# Patient Record
Sex: Female | Born: 2000 | Race: Black or African American | Hispanic: No | Marital: Single | Smoking: Never smoker
Health system: Southern US, Community
[De-identification: ages and names within clinical notes are randomized; demographics above are authoritative.]

## PROBLEM LIST (undated history)

## (undated) DIAGNOSIS — Z789 Other specified health status: Secondary | ICD-10-CM

---
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
## 2022-01-29 DIAGNOSIS — M5407 Panniculitis affecting regions of neck and back, lumbosacral region: Secondary | ICD-10-CM | POA: Diagnosis not present

## 2022-01-29 DIAGNOSIS — M9905 Segmental and somatic dysfunction of pelvic region: Secondary | ICD-10-CM | POA: Diagnosis not present

## 2022-01-29 DIAGNOSIS — M5406 Panniculitis affecting regions of neck and back, lumbar region: Secondary | ICD-10-CM | POA: Diagnosis not present

## 2022-01-29 DIAGNOSIS — M9903 Segmental and somatic dysfunction of lumbar region: Secondary | ICD-10-CM | POA: Diagnosis not present

## 2022-01-30 DIAGNOSIS — M5407 Panniculitis affecting regions of neck and back, lumbosacral region: Secondary | ICD-10-CM | POA: Diagnosis not present

## 2022-01-30 DIAGNOSIS — M9905 Segmental and somatic dysfunction of pelvic region: Secondary | ICD-10-CM | POA: Diagnosis not present

## 2022-01-30 DIAGNOSIS — M9903 Segmental and somatic dysfunction of lumbar region: Secondary | ICD-10-CM | POA: Diagnosis not present

## 2022-01-30 DIAGNOSIS — M5406 Panniculitis affecting regions of neck and back, lumbar region: Secondary | ICD-10-CM | POA: Diagnosis not present

## 2022-02-02 DIAGNOSIS — M5407 Panniculitis affecting regions of neck and back, lumbosacral region: Secondary | ICD-10-CM | POA: Diagnosis not present

## 2022-02-02 DIAGNOSIS — M5406 Panniculitis affecting regions of neck and back, lumbar region: Secondary | ICD-10-CM | POA: Diagnosis not present

## 2022-02-02 DIAGNOSIS — M9905 Segmental and somatic dysfunction of pelvic region: Secondary | ICD-10-CM | POA: Diagnosis not present

## 2022-02-02 DIAGNOSIS — M9903 Segmental and somatic dysfunction of lumbar region: Secondary | ICD-10-CM | POA: Diagnosis not present

## 2022-02-04 DIAGNOSIS — M5406 Panniculitis affecting regions of neck and back, lumbar region: Secondary | ICD-10-CM | POA: Diagnosis not present

## 2022-02-04 DIAGNOSIS — M5407 Panniculitis affecting regions of neck and back, lumbosacral region: Secondary | ICD-10-CM | POA: Diagnosis not present

## 2022-02-04 DIAGNOSIS — M9903 Segmental and somatic dysfunction of lumbar region: Secondary | ICD-10-CM | POA: Diagnosis not present

## 2022-02-04 DIAGNOSIS — M9905 Segmental and somatic dysfunction of pelvic region: Secondary | ICD-10-CM | POA: Diagnosis not present

## 2022-02-05 DIAGNOSIS — M9903 Segmental and somatic dysfunction of lumbar region: Secondary | ICD-10-CM | POA: Diagnosis not present

## 2022-02-05 DIAGNOSIS — M5407 Panniculitis affecting regions of neck and back, lumbosacral region: Secondary | ICD-10-CM | POA: Diagnosis not present

## 2022-02-05 DIAGNOSIS — M9905 Segmental and somatic dysfunction of pelvic region: Secondary | ICD-10-CM | POA: Diagnosis not present

## 2022-02-05 DIAGNOSIS — M5406 Panniculitis affecting regions of neck and back, lumbar region: Secondary | ICD-10-CM | POA: Diagnosis not present

## 2022-02-11 DIAGNOSIS — M9905 Segmental and somatic dysfunction of pelvic region: Secondary | ICD-10-CM | POA: Diagnosis not present

## 2022-02-11 DIAGNOSIS — M5406 Panniculitis affecting regions of neck and back, lumbar region: Secondary | ICD-10-CM | POA: Diagnosis not present

## 2022-02-11 DIAGNOSIS — M5407 Panniculitis affecting regions of neck and back, lumbosacral region: Secondary | ICD-10-CM | POA: Diagnosis not present

## 2022-02-11 DIAGNOSIS — M9903 Segmental and somatic dysfunction of lumbar region: Secondary | ICD-10-CM | POA: Diagnosis not present

## 2022-02-12 DIAGNOSIS — M5406 Panniculitis affecting regions of neck and back, lumbar region: Secondary | ICD-10-CM | POA: Diagnosis not present

## 2022-02-12 DIAGNOSIS — M9903 Segmental and somatic dysfunction of lumbar region: Secondary | ICD-10-CM | POA: Diagnosis not present

## 2022-02-12 DIAGNOSIS — M9905 Segmental and somatic dysfunction of pelvic region: Secondary | ICD-10-CM | POA: Diagnosis not present

## 2022-02-12 DIAGNOSIS — M5407 Panniculitis affecting regions of neck and back, lumbosacral region: Secondary | ICD-10-CM | POA: Diagnosis not present

## 2022-02-16 DIAGNOSIS — M9905 Segmental and somatic dysfunction of pelvic region: Secondary | ICD-10-CM | POA: Diagnosis not present

## 2022-02-16 DIAGNOSIS — M9903 Segmental and somatic dysfunction of lumbar region: Secondary | ICD-10-CM | POA: Diagnosis not present

## 2022-02-16 DIAGNOSIS — M5406 Panniculitis affecting regions of neck and back, lumbar region: Secondary | ICD-10-CM | POA: Diagnosis not present

## 2022-02-16 DIAGNOSIS — M5407 Panniculitis affecting regions of neck and back, lumbosacral region: Secondary | ICD-10-CM | POA: Diagnosis not present

## 2022-02-18 DIAGNOSIS — M9903 Segmental and somatic dysfunction of lumbar region: Secondary | ICD-10-CM | POA: Diagnosis not present

## 2022-02-18 DIAGNOSIS — M5407 Panniculitis affecting regions of neck and back, lumbosacral region: Secondary | ICD-10-CM | POA: Diagnosis not present

## 2022-02-18 DIAGNOSIS — M9905 Segmental and somatic dysfunction of pelvic region: Secondary | ICD-10-CM | POA: Diagnosis not present

## 2022-02-18 DIAGNOSIS — M5406 Panniculitis affecting regions of neck and back, lumbar region: Secondary | ICD-10-CM | POA: Diagnosis not present

## 2022-02-20 DIAGNOSIS — Z1339 Encounter for screening examination for other mental health and behavioral disorders: Secondary | ICD-10-CM | POA: Diagnosis not present

## 2022-02-20 DIAGNOSIS — M62830 Muscle spasm of back: Secondary | ICD-10-CM | POA: Diagnosis not present

## 2022-02-20 DIAGNOSIS — Z7689 Persons encountering health services in other specified circumstances: Secondary | ICD-10-CM | POA: Diagnosis not present

## 2022-02-20 DIAGNOSIS — R519 Headache, unspecified: Secondary | ICD-10-CM | POA: Diagnosis not present

## 2022-02-20 DIAGNOSIS — M546 Pain in thoracic spine: Secondary | ICD-10-CM | POA: Diagnosis not present

## 2022-02-26 DIAGNOSIS — M5407 Panniculitis affecting regions of neck and back, lumbosacral region: Secondary | ICD-10-CM | POA: Diagnosis not present

## 2022-02-26 DIAGNOSIS — M9905 Segmental and somatic dysfunction of pelvic region: Secondary | ICD-10-CM | POA: Diagnosis not present

## 2022-02-26 DIAGNOSIS — M5406 Panniculitis affecting regions of neck and back, lumbar region: Secondary | ICD-10-CM | POA: Diagnosis not present

## 2022-02-26 DIAGNOSIS — M9903 Segmental and somatic dysfunction of lumbar region: Secondary | ICD-10-CM | POA: Diagnosis not present

## 2022-03-02 DIAGNOSIS — M9905 Segmental and somatic dysfunction of pelvic region: Secondary | ICD-10-CM | POA: Diagnosis not present

## 2022-03-02 DIAGNOSIS — M5407 Panniculitis affecting regions of neck and back, lumbosacral region: Secondary | ICD-10-CM | POA: Diagnosis not present

## 2022-03-02 DIAGNOSIS — M5406 Panniculitis affecting regions of neck and back, lumbar region: Secondary | ICD-10-CM | POA: Diagnosis not present

## 2022-03-02 DIAGNOSIS — M9903 Segmental and somatic dysfunction of lumbar region: Secondary | ICD-10-CM | POA: Diagnosis not present

## 2022-03-05 DIAGNOSIS — M5407 Panniculitis affecting regions of neck and back, lumbosacral region: Secondary | ICD-10-CM | POA: Diagnosis not present

## 2022-03-05 DIAGNOSIS — M5406 Panniculitis affecting regions of neck and back, lumbar region: Secondary | ICD-10-CM | POA: Diagnosis not present

## 2022-03-05 DIAGNOSIS — M9905 Segmental and somatic dysfunction of pelvic region: Secondary | ICD-10-CM | POA: Diagnosis not present

## 2022-03-05 DIAGNOSIS — M9903 Segmental and somatic dysfunction of lumbar region: Secondary | ICD-10-CM | POA: Diagnosis not present

## 2022-03-26 DIAGNOSIS — Y998 Other external cause status: Secondary | ICD-10-CM | POA: Diagnosis not present

## 2022-03-26 DIAGNOSIS — M25561 Pain in right knee: Secondary | ICD-10-CM | POA: Diagnosis not present

## 2022-03-26 DIAGNOSIS — M25461 Effusion, right knee: Secondary | ICD-10-CM | POA: Diagnosis not present

## 2022-03-26 DIAGNOSIS — Y9367 Activity, basketball: Secondary | ICD-10-CM | POA: Diagnosis not present

## 2022-03-31 DIAGNOSIS — S83511A Sprain of anterior cruciate ligament of right knee, initial encounter: Secondary | ICD-10-CM | POA: Diagnosis not present

## 2022-03-31 DIAGNOSIS — S83411A Sprain of medial collateral ligament of right knee, initial encounter: Secondary | ICD-10-CM | POA: Diagnosis not present

## 2022-04-02 DIAGNOSIS — S83511A Sprain of anterior cruciate ligament of right knee, initial encounter: Secondary | ICD-10-CM | POA: Diagnosis not present

## 2022-04-02 DIAGNOSIS — M25561 Pain in right knee: Secondary | ICD-10-CM | POA: Diagnosis not present

## 2022-04-06 DIAGNOSIS — S83511A Sprain of anterior cruciate ligament of right knee, initial encounter: Secondary | ICD-10-CM | POA: Diagnosis not present

## 2022-04-08 ENCOUNTER — Other Ambulatory Visit: Payer: Self-pay | Admitting: Orthopedic Surgery

## 2022-04-15 DIAGNOSIS — M25661 Stiffness of right knee, not elsewhere classified: Secondary | ICD-10-CM | POA: Diagnosis not present

## 2022-04-15 DIAGNOSIS — S83511A Sprain of anterior cruciate ligament of right knee, initial encounter: Secondary | ICD-10-CM | POA: Diagnosis not present

## 2022-04-15 DIAGNOSIS — M25561 Pain in right knee: Secondary | ICD-10-CM | POA: Diagnosis not present

## 2022-04-15 DIAGNOSIS — M6281 Muscle weakness (generalized): Secondary | ICD-10-CM | POA: Diagnosis not present

## 2022-04-23 ENCOUNTER — Other Ambulatory Visit: Payer: Self-pay

## 2022-04-23 ENCOUNTER — Encounter
Admission: RE | Admit: 2022-04-23 | Discharge: 2022-04-23 | Disposition: A | Discharge status: Home / Self Care | Payer: BC Managed Care – PPO | Source: Ambulatory Visit | Attending: Orthopedic Surgery | Admitting: Orthopedic Surgery

## 2022-04-23 DIAGNOSIS — Z01812 Encounter for preprocedural laboratory examination: Secondary | ICD-10-CM

## 2022-04-23 NOTE — Patient Instructions (Addendum)
Your procedure is scheduled on: 04/30/22 - Thursday Report to the Registration Desk on the 1st floor of the [facility]. To find out your arrival time, please call [phone] between 1PM - 3PM on: 04/29/22 - Wednesday If your arrival time is 6:00 am, do not arrive prior to that time as the [facility] entrance doors do not open until 6:00 am.  REMEMBER: Instructions that are not followed completely may result in serious medical risk, up to and including death; or upon the discretion of your surgeon and anesthesiologist your surgery may need to be rescheduled.  Do not eat food after midnight the night before surgery.  No gum chewing, lozengers or hard candies.  You may however, drink CLEAR liquids up to 2 hours before you are scheduled to arrive for your surgery. Do not drink anything within 2 hours of your scheduled arrival time.  Clear liquids include: - water  - apple juice without pulp - gatorade (not RED colors) - black coffee or tea (Do NOT add milk or creamers to the coffee or tea) Do NOT drink anything that is not on this list.  In addition, your doctor has ordered for you to drink the provided  Ensure Pre-Surgery Clear Carbohydrate Drink  Drinking this carbohydrate drink up to two hours before surgery helps to reduce insulin resistance and improve patient outcomes. Please complete drinking 2 hours prior to scheduled arrival time.  TAKE THESE MEDICATIONS THE MORNING OF SURGERY WITH A SIP OF WATER: NONE  One week prior to surgery: Stop Anti-inflammatories (NSAIDS) such as Advil, Aleve, Ibuprofen, Motrin, Naproxen, Naprosyn and Aspirin based products such as Excedrin, Goodys Powder, BC Powder.  Stop ANY OVER THE COUNTER supplements until after surgery.  You may however, continue to take Tylenol if needed for pain up until the day of surgery.  No Alcohol for 24 hours before or after surgery.  No Smoking including e-cigarettes for 24 hours prior to surgery.  No chewable  tobacco products for at least 6 hours prior to surgery.  No nicotine patches on the day of surgery.  Do not use any "recreational" drugs for at least a week prior to your surgery.  Please be advised that the combination of cocaine and anesthesia may have negative outcomes, up to and including death. If you test positive for cocaine, your surgery will be cancelled.  On the morning of surgery brush your teeth with toothpaste and water, you may rinse your mouth with mouthwash if you wish. Do not swallow any toothpaste or mouthwash.  Use CHG Soap or wipes as directed on instruction sheet.  Do not wear jewelry, make-up, hairpins, clips or nail polish.  Do not wear lotions, powders, or perfumes.   Do not shave body from the neck down 48 hours prior to surgery just in case you cut yourself which could leave a site for infection.  Also, freshly shaved skin may become irritated if using the CHG soap.  Contact lenses, hearing aids and dentures may not be worn into surgery.  Do not bring valuables to the hospital. [facility] is not responsible for any missing/lost belongings or valuables.   Notify your doctor if there is any change in your medical condition (cold, fever, infection).  Wear comfortable clothing (specific to your surgery type) to the hospital.  After surgery, you can help prevent lung complications by doing breathing exercises.  Take deep breaths and cough every 1-2 hours. Your doctor may order a device called an Incentive Spirometer to help  you take deep breaths. When coughing or sneezing, hold a pillow firmly against your incision with both hands. This is called "splinting." Doing this helps protect your incision. It also decreases belly discomfort.  If you are being admitted to the hospital overnight, leave your suitcase in the car. After surgery it may be brought to your room.  If you are being discharged the day of surgery, you will not be allowed to drive home. You will  need a responsible adult (18 years or older) to drive you home and stay with you that night.   If you are taking public transportation, you will need to have a responsible adult (18 years or older) with you. Please confirm with your physician that it is acceptable to use public transportation.   Please call the Pre-admissions Testing Dept. at [phone] if you have any questions about these instructions.  Surgery Visitation Policy:  Patients undergoing a surgery or procedure may have two family members or support persons with them as long as the person is not COVID-19 positive or experiencing its symptoms.   Inpatient Visitation:    Visiting hours are 7 a.m. to 8 p.m. Up to four visitors are allowed at one time in a patient room. The visitors may rotate out with other people during the day. One designated support person (adult) may remain overnight.  Due to an increase in RSV and influenza rates and associated hospitalizations, children ages 33 and under will not be able to visit patients in [facility]. Masks continue to be strongly recommended.

## 2022-04-29 MED ORDER — ORAL CARE MOUTH RINSE: 15.0000 mL | Freq: Once | OROMUCOSAL | Status: AC

## 2022-04-29 MED ORDER — CEFAZOLIN SODIUM-DEXTROSE 2-4 GM/100ML-% IV SOLN
2.0000 g | INTRAVENOUS | Status: AC
  Administered 2022-04-30: 2 g via INTRAVENOUS

## 2022-04-29 MED ORDER — LACTATED RINGERS IV SOLN: INTRAVENOUS | Status: DC

## 2022-04-29 MED ORDER — FAMOTIDINE 20 MG PO TABS: 20.0000 mg | ORAL_TABLET | Freq: Once | ORAL | Status: AC

## 2022-04-29 MED ORDER — CHLORHEXIDINE GLUCONATE 0.12 % MT SOLN: 15.0000 mL | Freq: Once | OROMUCOSAL | Status: AC

## 2022-04-30 ENCOUNTER — Ambulatory Visit: Payer: BC Managed Care – PPO | Admitting: Certified Registered"

## 2022-04-30 ENCOUNTER — Encounter
Admission: RE | Disposition: A | Discharge status: Home / Self Care | Payer: Self-pay | Source: Ambulatory Visit | Attending: Orthopedic Surgery

## 2022-04-30 ENCOUNTER — Ambulatory Visit
Admission: RE | Admit: 2022-04-30 | Discharge: 2022-04-30 | Disposition: A | Discharge status: Home / Self Care | Payer: BC Managed Care – PPO | Source: Ambulatory Visit | Attending: Orthopedic Surgery | Admitting: Orthopedic Surgery

## 2022-04-30 ENCOUNTER — Other Ambulatory Visit: Payer: Self-pay

## 2022-04-30 ENCOUNTER — Encounter: Payer: Self-pay | Admitting: Orthopedic Surgery

## 2022-04-30 ENCOUNTER — Ambulatory Visit: Payer: BC Managed Care – PPO

## 2022-04-30 DIAGNOSIS — Y9367 Activity, basketball: Secondary | ICD-10-CM | POA: Diagnosis not present

## 2022-04-30 DIAGNOSIS — S83511A Sprain of anterior cruciate ligament of right knee, initial encounter: Secondary | ICD-10-CM | POA: Diagnosis not present

## 2022-04-30 DIAGNOSIS — Z01812 Encounter for preprocedural laboratory examination: Secondary | ICD-10-CM

## 2022-04-30 HISTORY — PX: KNEE ARTHROSCOPY WITH ANTERIOR CRUCIATE LIGAMENT (ACL) REPAIR WITH HAMSTRING GRAFT: SHX5645

## 2022-04-30 LAB — POCT PREGNANCY, URINE: Preg Test, Ur: NEGATIVE

## 2022-04-30 SURGERY — KNEE ARTHROSCOPY WITH ANTERIOR CRUCIATE LIGAMENT (ACL) REPAIR WITH HAMSTRING GRAFT
Anesthesia: General | Site: Knee | Laterality: Right

## 2022-04-30 MED ORDER — CEFAZOLIN SODIUM-DEXTROSE 2-4 GM/100ML-% IV SOLN
INTRAVENOUS | Status: AC
  Filled 2022-04-30: qty 100

## 2022-04-30 MED ORDER — SEVOFLURANE IN SOLN
RESPIRATORY_TRACT | Status: AC
  Filled 2022-04-30: qty 250

## 2022-04-30 MED ORDER — DEXMEDETOMIDINE HCL IN NACL 200 MCG/50ML IV SOLN
INTRAVENOUS | Status: DC | PRN
  Administered 2022-04-30 (×2): 4 ug via INTRAVENOUS

## 2022-04-30 MED ORDER — PROPOFOL 10 MG/ML IV BOLUS
INTRAVENOUS | Status: AC
  Filled 2022-04-30: qty 40

## 2022-04-30 MED ORDER — FENTANYL CITRATE (PF) 100 MCG/2ML IJ SOLN
INTRAMUSCULAR | Status: AC
  Filled 2022-04-30: qty 2

## 2022-04-30 MED ORDER — VANCOMYCIN HCL 1000 MG IV SOLR
INTRAVENOUS | Status: AC
  Filled 2022-04-30: qty 20

## 2022-04-30 MED ORDER — OXYCODONE HCL 5 MG PO TABS
ORAL_TABLET | ORAL | Status: AC
  Filled 2022-04-30: qty 1

## 2022-04-30 MED ORDER — BUPIVACAINE HCL (PF) 0.5 % IJ SOLN
INTRAMUSCULAR | Status: DC | PRN
  Administered 2022-04-30: 13 mL

## 2022-04-30 MED ORDER — FAMOTIDINE 20 MG PO TABS
ORAL_TABLET | ORAL | Status: AC
  Administered 2022-04-30: 20 mg via ORAL
  Filled 2022-04-30: qty 1

## 2022-04-30 MED ORDER — TRANEXAMIC ACID-NACL 1000-0.7 MG/100ML-% IV SOLN
INTRAVENOUS | Status: AC
  Filled 2022-04-30: qty 100

## 2022-04-30 MED ORDER — GLYCOPYRROLATE 0.2 MG/ML IJ SOLN
INTRAMUSCULAR | Status: AC
  Filled 2022-04-30: qty 1

## 2022-04-30 MED ORDER — ONDANSETRON HCL 4 MG/2ML IJ SOLN
INTRAMUSCULAR | Status: DC | PRN
  Administered 2022-04-30: 4 mg via INTRAVENOUS

## 2022-04-30 MED ORDER — LIDOCAINE HCL (CARDIAC) PF 100 MG/5ML IV SOSY
PREFILLED_SYRINGE | INTRAVENOUS | Status: DC | PRN
  Administered 2022-04-30: 60 mg via INTRAVENOUS

## 2022-04-30 MED ORDER — LIDOCAINE-EPINEPHRINE (PF) 1 %-1:200000 IJ SOLN
INTRAMUSCULAR | Status: AC
  Filled 2022-04-30: qty 30

## 2022-04-30 MED ORDER — CHLORHEXIDINE GLUCONATE 0.12 % MT SOLN
OROMUCOSAL | Status: AC
  Administered 2022-04-30: 15 mL via OROMUCOSAL
  Filled 2022-04-30: qty 15

## 2022-04-30 MED ORDER — PROMETHAZINE HCL 25 MG/ML IJ SOLN: 6.2500 mg | INTRAMUSCULAR | Status: DC | PRN

## 2022-04-30 MED ORDER — ACETAMINOPHEN 500 MG PO TABS: 1000.0000 mg | ORAL_TABLET | Freq: Three times a day (TID) | ORAL | 2 refills | Status: DC

## 2022-04-30 MED ORDER — OXYCODONE HCL 5 MG PO TABS: 5.0000 mg | ORAL_TABLET | ORAL | 0 refills | Status: DC | PRN

## 2022-04-30 MED ORDER — DEXAMETHASONE SODIUM PHOSPHATE 10 MG/ML IJ SOLN
INTRAMUSCULAR | Status: AC
  Filled 2022-04-30: qty 1

## 2022-04-30 MED ORDER — DEXMEDETOMIDINE HCL IN NACL 80 MCG/20ML IV SOLN
INTRAVENOUS | Status: AC
  Filled 2022-04-30: qty 20

## 2022-04-30 MED ORDER — EPINEPHRINE PF 1 MG/ML IJ SOLN
INTRAMUSCULAR | Status: AC
  Filled 2022-04-30: qty 3

## 2022-04-30 MED ORDER — HYDROMORPHONE HCL 1 MG/ML IJ SOLN
INTRAMUSCULAR | Status: AC
  Filled 2022-04-30: qty 1

## 2022-04-30 MED ORDER — ACETAMINOPHEN 10 MG/ML IV SOLN
INTRAVENOUS | Status: AC
  Filled 2022-04-30: qty 100

## 2022-04-30 MED ORDER — GABAPENTIN 300 MG PO CAPS: 300.0000 mg | ORAL_CAPSULE | Freq: Three times a day (TID) | ORAL | 0 refills | Status: DC

## 2022-04-30 MED ORDER — EPINEPHRINE PF 1 MG/ML IJ SOLN
INTRAMUSCULAR | Status: AC
  Filled 2022-04-30: qty 1

## 2022-04-30 MED ORDER — IBUPROFEN 800 MG PO TABS: 800.0000 mg | ORAL_TABLET | Freq: Three times a day (TID) | ORAL | 0 refills | Status: AC

## 2022-04-30 MED ORDER — KETOROLAC TROMETHAMINE 30 MG/ML IJ SOLN
INTRAMUSCULAR | Status: AC
  Filled 2022-04-30: qty 1

## 2022-04-30 MED ORDER — FENTANYL CITRATE (PF) 100 MCG/2ML IJ SOLN
INTRAMUSCULAR | Status: DC | PRN
  Administered 2022-04-30 (×2): 50 ug via INTRAVENOUS

## 2022-04-30 MED ORDER — TRANEXAMIC ACID-NACL 1000-0.7 MG/100ML-% IV SOLN
1000.0000 mg | Freq: Once | INTRAVENOUS | Status: AC
  Administered 2022-04-30: 1000 mg via INTRAVENOUS

## 2022-04-30 MED ORDER — DEXAMETHASONE SODIUM PHOSPHATE 10 MG/ML IJ SOLN
INTRAMUSCULAR | Status: DC | PRN
  Administered 2022-04-30: 10 mg via INTRAVENOUS

## 2022-04-30 MED ORDER — ONDANSETRON HCL 4 MG/2ML IJ SOLN
INTRAMUSCULAR | Status: AC
  Filled 2022-04-30: qty 2

## 2022-04-30 MED ORDER — BUPIVACAINE HCL (PF) 0.5 % IJ SOLN
INTRAMUSCULAR | Status: AC
  Filled 2022-04-30: qty 30

## 2022-04-30 MED ORDER — DIAZEPAM 5 MG PO TABS: 5.0000 mg | ORAL_TABLET | Freq: Three times a day (TID) | ORAL | 0 refills | Status: DC | PRN

## 2022-04-30 MED ORDER — MIDAZOLAM HCL 2 MG/2ML IJ SOLN
INTRAMUSCULAR | Status: DC | PRN
  Administered 2022-04-30 (×2): 1 mg via INTRAVENOUS

## 2022-04-30 MED ORDER — OXYCODONE HCL 5 MG PO TABS
5.0000 mg | ORAL_TABLET | Freq: Once | ORAL | Status: AC
  Administered 2022-04-30: 5 mg via ORAL

## 2022-04-30 MED ORDER — FENTANYL CITRATE (PF) 100 MCG/2ML IJ SOLN
25.0000 ug | INTRAMUSCULAR | Status: DC | PRN
  Administered 2022-04-30 (×2): 25 ug via INTRAVENOUS

## 2022-04-30 MED ORDER — PROPOFOL 10 MG/ML IV BOLUS
INTRAVENOUS | Status: DC | PRN
  Administered 2022-04-30: 150 mg via INTRAVENOUS

## 2022-04-30 MED ORDER — HYDROMORPHONE HCL 1 MG/ML IJ SOLN
INTRAMUSCULAR | Status: DC | PRN
  Administered 2022-04-30: 1 mg via INTRAVENOUS

## 2022-04-30 MED ORDER — ONDANSETRON 4 MG PO TBDP: 4.0000 mg | ORAL_TABLET | Freq: Three times a day (TID) | ORAL | 0 refills | Status: DC | PRN

## 2022-04-30 MED ORDER — KETOROLAC TROMETHAMINE 30 MG/ML IJ SOLN
INTRAMUSCULAR | Status: DC | PRN
  Administered 2022-04-30: 30 mg via INTRAVENOUS

## 2022-04-30 MED ORDER — ACETAMINOPHEN 10 MG/ML IV SOLN
INTRAVENOUS | Status: DC | PRN
  Administered 2022-04-30: 1000 mg via INTRAVENOUS

## 2022-04-30 MED ORDER — ASPIRIN 325 MG PO TBEC: 325.0000 mg | DELAYED_RELEASE_TABLET | Freq: Every day | ORAL | 0 refills | Status: AC

## 2022-04-30 MED ORDER — MIDAZOLAM HCL 2 MG/2ML IJ SOLN
INTRAMUSCULAR | Status: AC
  Filled 2022-04-30: qty 2

## 2022-04-30 MED ORDER — VANCOMYCIN HCL 1000 MG IV SOLR
INTRAVENOUS | Status: DC | PRN
  Administered 2022-04-30: 1000 mg

## 2022-04-30 MED ORDER — LACTATED RINGERS IR SOLN
Status: DC | PRN
  Administered 2022-04-30 (×2): 3000 mL

## 2022-04-30 MED ORDER — LACTATED RINGERS IV SOLN
INTRAVENOUS | Status: DC | PRN
  Administered 2022-04-30 (×4): 3001 mL

## 2022-04-30 MED ORDER — PHENYLEPHRINE HCL (PRESSORS) 10 MG/ML IV SOLN
INTRAVENOUS | Status: DC | PRN
  Administered 2022-04-30: 80 ug via INTRAVENOUS

## 2022-04-30 SURGICAL SUPPLY — 83 items
ADAPTER IRRIG TUBE 2 SPIKE SOL (ADAPTER) ×2 IMPLANT
ANCHOR BUTTON TIGHTROPE 14 (Anchor) IMPLANT
BLADE SHAVER 4.5X7 STR FR (MISCELLANEOUS) ×1 IMPLANT
BLADE SURG 15 STRL LF DISP TIS (BLADE) ×3 IMPLANT
BLADE SURG 15 STRL SS (BLADE) ×3
BLADE SURG SZ10 CARB STEEL (BLADE) ×1 IMPLANT
BLADE SURG SZ11 CARB STEEL (BLADE) ×1 IMPLANT
BNDG COHESIVE 4X5 TAN STRL LF (GAUZE/BANDAGES/DRESSINGS) ×1 IMPLANT
BNDG COHESIVE 6X5 TAN ST LF (GAUZE/BANDAGES/DRESSINGS) ×1 IMPLANT
BNDG ESMARK 6X12 TAN STRL LF (GAUZE/BANDAGES/DRESSINGS) ×1 IMPLANT
BRUSH SCRUB EZ  4% CHG (MISCELLANEOUS) ×1
BRUSH SCRUB EZ 4% CHG (MISCELLANEOUS) ×1 IMPLANT
BUR BR 5.5 WIDE MOUTH (BURR) IMPLANT
CHLORAPREP W/TINT 26 (MISCELLANEOUS) ×2 IMPLANT
COOLER POLAR GLACIER W/PUMP (MISCELLANEOUS) ×1 IMPLANT
COVER BACK TABLE REUSABLE LG (DRAPES) ×1 IMPLANT
CUFF TOURN SGL QUICK 24 (TOURNIQUET CUFF)
CUFF TOURN SGL QUICK 34 (TOURNIQUET CUFF)
CUFF TRNQT CYL 24X4X16.5-23 (TOURNIQUET CUFF) IMPLANT
CUFF TRNQT CYL 34X4.125X (TOURNIQUET CUFF) IMPLANT
DERMABOND ADVANCED .7 DNX12 (GAUZE/BANDAGES/DRESSINGS) ×1 IMPLANT
DRAPE 3/4 80X56 (DRAPES) ×1 IMPLANT
DRAPE ARTHRO LIMB 89X125 STRL (DRAPES) ×1 IMPLANT
DRAPE FLUOR MINI C-ARM 54X84 (DRAPES) ×1 IMPLANT
DRAPE IMP U-DRAPE 54X76 (DRAPES) ×1 IMPLANT
DRAPE POUCH INSTRU U-SHP 10X18 (DRAPES) ×1 IMPLANT
DRILL FLIPCUTTER III 6-12 (ORTHOPEDIC DISPOSABLE SUPPLIES) IMPLANT
ELECT REM PT RETURN 9FT ADLT (ELECTROSURGICAL) ×1
ELECTRODE REM PT RTRN 9FT ADLT (ELECTROSURGICAL) ×1 IMPLANT
FLIPCUTTER III 6-12 AR-1204FF (ORTHOPEDIC DISPOSABLE SUPPLIES) ×1
GAUZE SPONGE 4X4 12PLY STRL (GAUZE/BANDAGES/DRESSINGS) ×1 IMPLANT
GAUZE XEROFORM 1X8 LF (GAUZE/BANDAGES/DRESSINGS) ×1 IMPLANT
GLOVE BIOGEL PI IND STRL 8 (GLOVE) ×2 IMPLANT
GLOVE SURG ORTHO 8.0 STRL STRW (GLOVE) ×1 IMPLANT
GLOVE SURG SYN 8.0 (GLOVE) ×1 IMPLANT
GLOVE SURG SYN 8.0 PF PI (GLOVE) ×1 IMPLANT
GOWN STRL REUS W/ TWL LRG LVL3 (GOWN DISPOSABLE) ×1 IMPLANT
GOWN STRL REUS W/ TWL XL LVL3 (GOWN DISPOSABLE) ×1 IMPLANT
GOWN STRL REUS W/TWL LRG LVL3 (GOWN DISPOSABLE) ×1
GOWN STRL REUS W/TWL XL LVL3 (GOWN DISPOSABLE) ×1
GRADUATE 1200CC STRL 31836 (MISCELLANEOUS) ×1 IMPLANT
GUIDEWIRE 1.2MMX18 (WIRE) ×1 IMPLANT
IMP SYS 2ND FIX PEEK 4.75X19.1 (Miscellaneous) ×1 IMPLANT
IMPL SYS 2ND FX PEEK 4.75X19.1 (Miscellaneous) IMPLANT
IMPL TIGHTROP ABS ACL FIBERTG (Orthopedic Implant) IMPLANT
IMPL TIGHTROP FIBERTAG ACL (Orthopedic Implant) IMPLANT
IMPL TIGHTROPE ABS ACL FIBERTG (Orthopedic Implant) ×1 IMPLANT
IMPLANT TIGHTROPE FIBERTAG ACL (Orthopedic Implant) ×1 IMPLANT
IV LACTATED RINGER IRRG 3000ML (IV SOLUTION) ×6
IV LR IRRIG 3000ML ARTHROMATIC (IV SOLUTION) ×6 IMPLANT
KIT TRANSTIBIAL (DISPOSABLE) IMPLANT
KIT TURNOVER KIT A (KITS) ×1 IMPLANT
KNIFE BLADE PARALLEL SZ9 (BLADE) IMPLANT
MANIFOLD NEPTUNE II (INSTRUMENTS) ×2 IMPLANT
MAT ABSORB  FLUID 56X50 GRAY (MISCELLANEOUS) ×2
MAT ABSORB FLUID 56X50 GRAY (MISCELLANEOUS) ×2 IMPLANT
NEEDLE HYPO 22GX1.5 SAFETY (NEEDLE) ×1 IMPLANT
PACK ARTHROSCOPY KNEE (MISCELLANEOUS) ×1 IMPLANT
PAD ABD DERMACEA PRESS 5X9 (GAUZE/BANDAGES/DRESSINGS) ×2 IMPLANT
PAD WRAPON POLAR KNEE (MISCELLANEOUS) ×1 IMPLANT
PENCIL SMOKE EVACUATOR (MISCELLANEOUS) ×1 IMPLANT
REAMER ARTHRO 9.5 SS (MISCELLANEOUS) IMPLANT
SHAVER BLADE BONE CUTTER  5.5 (BLADE)
SHAVER BLADE BONE CUTTER 5.5 (BLADE) IMPLANT
SLEEVE REMOTE CONTROL 5X12 (DRAPES) IMPLANT
SPONGE T-LAP 18X18 ~~LOC~~+RFID (SPONGE) ×3 IMPLANT
SUT ETHILON 3-0 FS-10 30 BLK (SUTURE) ×1
SUT FIBERSNARE 2 CLSD LOOP (SUTURE) IMPLANT
SUT FIBERWIRE #2 38 T-5 BLUE (SUTURE) ×2
SUT MNCRL AB 4-0 PS2 18 (SUTURE) ×2 IMPLANT
SUT VIC AB 0 CT1 36 (SUTURE) ×1 IMPLANT
SUT VIC AB 2-0 CT2 27 (SUTURE) ×2 IMPLANT
SUTURE EHLN 3-0 FS-10 30 BLK (SUTURE) ×1 IMPLANT
SUTURE FIBERWR #2 38 T-5 BLUE (SUTURE) ×2 IMPLANT
SYR BULB IRRIG 60ML STRL (SYRINGE) ×1 IMPLANT
TAPE LABRALWHITE 1.5X36 (TAPE) IMPLANT
TRAP FLUID SMOKE EVACUATOR (MISCELLANEOUS) ×1 IMPLANT
TRAY FOLEY SLVR 16FR LF STAT (SET/KITS/TRAYS/PACK) ×1 IMPLANT
TUBING INFLOW SET DBFLO PUMP (TUBING) ×1 IMPLANT
TUBING OUTFLOW SET DBLFO PUMP (TUBING) ×1 IMPLANT
WAND WEREWOLF FLOW 90D (MISCELLANEOUS) IMPLANT
WATER STERILE IRR 500ML POUR (IV SOLUTION) ×1 IMPLANT
WRAPON POLAR PAD KNEE (MISCELLANEOUS) ×1

## 2022-04-30 NOTE — Anesthesia Preprocedure Evaluation (Signed)
Anesthesia Evaluation  Patient identified by MRN, date of birth, ID band Patient awake    Reviewed: Allergy & Precautions, H&P , NPO status , Patient's Chart, lab work & pertinent test results, reviewed documented beta blocker date and time   History of Anesthesia Complications Negative for: history of anesthetic complications  Airway Mallampati: III  TM Distance: >3 FB Neck ROM: full    Dental  (+) Dental Advidsory Given, Teeth Intact   Pulmonary neg pulmonary ROS   Pulmonary exam normal breath sounds clear to auscultation       Cardiovascular Exercise Tolerance: Good negative cardio ROS Normal cardiovascular exam Rhythm:regular Rate:Normal     Neuro/Psych negative neurological ROS  negative psych ROS   GI/Hepatic negative GI ROS, Neg liver ROS,,,  Endo/Other  negative endocrine ROS    Renal/GU negative Renal ROS  negative genitourinary   Musculoskeletal   Abdominal   Peds  Hematology negative hematology ROS (+)   Anesthesia Other Findings Past Medical History: 2023: ACL injury tear No date: No known health problems   Reproductive/Obstetrics negative OB ROS                             Anesthesia Physical Anesthesia Plan  ASA: 1  Anesthesia Plan: General   Post-op Pain Management:    Induction: Intravenous  PONV Risk Score and Plan: 3 and Ondansetron, Dexamethasone, Midazolam and Treatment may vary due to age or medical condition  Airway Management Planned: Oral ETT and LMA  Additional Equipment:   Intra-op Plan:   Post-operative Plan: Extubation in OR  Informed Consent: I have reviewed the patients History and Physical, chart, labs and discussed the procedure including the risks, benefits and alternatives for the proposed anesthesia with the patient or authorized representative who has indicated his/her understanding and acceptance.     Dental Advisory Given  Plan  Discussed with: Anesthesiologist, CRNA and Surgeon  Anesthesia Plan Comments:        Anesthesia Quick Evaluation

## 2022-04-30 NOTE — Op Note (Signed)
Operative Note    SURGERY DATE: 04/30/2022   PRE-OP DIAGNOSIS:  1.  Right knee anterior cruciate ligament tear   POST-OP DIAGNOSIS:  1.  Right knee anterior cruciate ligament tear  PROCEDURES:  1.  Right knee anterior cruciate ligament reconstruction with quadriceps tendon autograft   SURGEON: Cato Mulligan, MD  ASSISTANT: Reche Dixon, PA   ANESTHESIA: Gen    ESTIMATED BLOOD LOSS: 2cc   TOTAL IV FLUIDS: per anesthesia  INDICATION(S):  Lynn Booker is a 22 y.o. female who suffered a knee injury while playing basketball. MRI showed an ACL tear, which was consistent with the clinical exam.  We discussed risks of surgery including but not limited to possible ACL and/or meniscus re-tear, infection, bleeding, muscle/nerve damage, DVT, complications of anesthesia, and postoperative knee pain and arthrofibrosis. After discussion of risks, benefits, and alternatives to surgery, the patient and family elected to proceed.  After discussion of risks, benefits, and alternatives to surgery, the patient elected to proceed.     OPERATIVE FINDINGS:    Examination under anesthesia: A careful examination under anesthesia was performed.  Passive range of motion was: Hyperextension: 2.  Extension: 0.  Flexion: 140.  Lachman: 2B. Pivot Shift: grade 1.  Posterior drawer: normal.  Varus stability in full extension: normal.  Varus stability in 30 degrees of flexion: normal.  Valgus stability in full extension: normal.  Valgus stability in 30 degrees of flexion: normal.   Intra-operative findings: A thorough arthroscopic examination of the knee was performed.  The findings are: 1. Suprapatellar pouch: Normal 2. Undersurface of median ridge: Normal 3. Medial patellar facet: Normal 4. Lateral patellar facet: Normal 5. Trochlea: Normal 6. Lateral gutter/popliteus tendon: Normal 7. Hoffa's fat pad: Normal 8. Medial gutter/plica: Normal 9. ACL: Abnormal: Complete of the posterolateral bundle; intact, but  lax fibers anteromedial bundle 10. PCL: Normal 11. Medial meniscus: Normal 12. Medial compartment cartilage: Normal 13. Lateral meniscus: Normal 14. Lateral compartment cartilage: Normal, except small focal area of grade 1 changes to tibial plateau   OPERATIVE REPORT:    I identified Lynn Booker in the pre-operative holding area.  I marked the operative knee with my initials. I reviewed the risks and benefits of the proposed surgical intervention and the patient (and/or patient's guardian) wished to proceed.  Regional anesthesia was then performed by the anesthesia team.  The patient was transferred to the operative suite and placed in the supine position with all bony prominences padded.  Care was taken to ensure that the contralateral leg was placed in neutral position and that the operative leg was well-padded in the leg holder.     Appropriate IV antibiotics were administered within 30 minutes of incision. The extremity was then prepped and draped in standard fashion. A time out was performed confirming the correct extremity, correct patient and correct procedure.   I first directed my attention to the harvest of a quadriceps autograft.  The right lower extremity was exsanguinated with an Esmarch, and a thigh tourniquet was elevated to 250 mmHg.  The total tourniquet was let down after skin closure, and total tourniquet time was 117 minutes.  A 3cm incision was planned just proximal to the proximal pole of the patella.  The incision was made with a 15 blade, and subcutaneous fat was sharply excised to expose the quadriceps tendon.  A speculum retractor was placed anteriorly, and the quadriceps was easily visualized with the arthroscope.  The vastus lateralis and VMO were clearly identified, as was  the junction of the rectus femoris muscle with the proximal aspect of the quadriceps tendon.  Under direct visualization with the arthroscope, an Arthrex 3mm parallel blade was used to incise the  quadriceps tendon from its most proximal extent, to the junction with the patella. Care was taken not to violate the rectus femoris muscle.  Then, using a 15 blade, the graft was transected and elevated distally off the patella, creating a 7 mm thick partial thickness graft. The distal end of the graft was controlled with a #2 Fiberwire stitch, and the Arthrex quadriceps harvester/cutter was loaded over the graft.  At a length of 62 mm, the harvester/cutter was used to transect the graft proximally, and the graft was removed from the wound.  The arthroscope was used to confirm a partial thickness harvest with no violation of the anterior knee capsule.     On the back table, the graft was prepared in standard fashion.  The length of the graft was 62 mm.  Each end was prepared using an Arboriculturist.  The femoral end was secured around a TightRope RT, and the tibial end was secured around an ABS loop.  Additionally a LabralTape was placed through the femoral button to serve as an internal brace.  The femoral end of the graft was 19mm in diameter, the tibial end was 9.67mm in diameter. The graft was tensioned to 20 lbs and reserved for later use.  Of note, it was soaked in 5 mg/mL vancomycin solution to reduce risk of infection for at least 20 minutes prior to implantation.   Standard anterolateral portals was created with an 11-blade.  The arthroscope was introduced through the anterolateral portal, and a full diagnostic arthroscopy was performed as described above.   An anteromedial portal was made under needle localization. A shaver was introduced through the anteromedial portal and used to gently debride the fat pad to improve visualization. Then the ACL remnant was debrided using the shaver, leaving 1-2 mm stumps on the tibia for anatomic referencing.   Next, I created the femoral socket. This was performed with an outside-in technique using an Forensic psychologist. The retrograde femoral guide was  utilized. We made the lateral stab incision with a 15 blade and followed the angle of the drill sleeve to make the incision through the IT band down to bone. The drill sleeve was pushed down to bone on the lateral femoral condyle and the guide was placed on the anatomic footprint of the ACL. A 5mm tunnel 61mm in length was drilled. We then used a FiberStick to pass a suture through the femoral tunnel and out of the anteromedial portal.    I then directed my attention to preparation of the tibial tunnel. A tibial guide set at 60 degrees was inserted through the anteromedial portal and centered over the tibial footprint.  The drill sleeve was then advanced to the proximal medial tibia just at the junction of the tibial tubercle and the pes tendon, through a ~3cm incision.  The anticipated tunnel length was 40mm.  A guide pin was then drilled through the proximal tibia under direct arthroscopic visualization into the center of the ACL footprint.  This was then over-reamed with an Arthrex 9.29mm reamer. Bony debris and soft tissue was cleared from the metaphyseal opening with electrocautery and from the intra-articular aperture with a shaver.   The passing suture was then brought out of the tibial tunnel.  The graft was then advanced into place in standard fashion.  The femoral Tight Rope was deployed on the lateral cortex under direct arthroscopic visualization from the anteromedial portal.  Correct position on the lateral cortex was confirmed fluoroscopically.  The TightRope was then shortened until at least 20 mm of graft was in the femoral tunnel.     I then directed my attention to tibial fixation.  This was performed with the knee in full extension with an axial and posterior drawer load applied to the tibia.  A 64mm ABS concave button was loaded over the ABS loop, and the loop was shortened until the button was flush with the anteromedial tibial cortex. The knee was then cycled 20 times, and both the femoral  and tibial button were tightened as much as possible with the knee in full extension. The tibial sutures were tightened.  A hole for a 4.75 mm SwiveLock was drilled approximately 2 cm distal to the tibial tunnel.  The ends of the tibial sutures and FiberTape for internal brace were placed under tension and the anchor was advanced.  This served as a back-up tibial fixation.   A repeat examination under anesthesia was performed.  The patient retained full hyperextension and flexion. The Lachman's was normalized. The arthroscope was re-introduced into the knee joint, confirming excellent position and tension of the quadriceps autograft. There was no lateral wall or roof impingement. Tibial and femoral sutures were cut.   The wounds were irrigated. 2-0 Vicryl was used to close the subdermal layers of both the quadriceps tendon harvest and the tibial tunnel incisions.  The harvest incision and the proximal medial tibial incisions were then closed with 4-0 Monocryl and Dermabond.  The arthroscopy portals and lateral femoral incision were closed with 3-0 Nylon.  A sterile dressing was applied, followed by a Polar Care device and a hinged knee brace locked in full extension.   The patient was awakened from anesthesia without difficulty and was transferred to the PACU in stable condition.   Of note, assistance from a PA was essential to performing the surgery.  PA was present for the entire surgery.  PA assisted with patient positioning, graft preparation, retraction, instrumentation, and wound closure. The surgery would have been more difficult and had longer operative time without PA assistance.    POSTOPERATIVE PLAN: The patient will be discharged home today once they meet PACU criteria. WBAT with hinged knee brace locked in extension.  Aspirin 325 mg daily for 2 weeks for DVT prophylaxis. Start physical therapy on POD#3-4. Follow up in 2 weeks per protocol.

## 2022-04-30 NOTE — H&P (Signed)
Delayed entry. Reviewed @720am .  Paper H&P to be scanned into permanent record. H&P reviewed. No significant changes noted.

## 2022-04-30 NOTE — Discharge Instructions (Addendum)
Arthroscopic ACL Surgery   Post-Op Instructions   1. Bracing or crutches: Crutches will be provided at the time of discharge from the surgery center.    2. Ice: You may be provided with a device [facility]) that allows you to ice the affected area effectively. Otherwise you can ice manually.   3. Driving:  Driving: Off all narcotic pain meds when operating vehicle   1 week for automatic cars, left leg surgery  2-4 weeks for standard/manual cars or right leg surgery   4. Activity: Ankle pumps several times an hour while awake to prevent blood clots. Weight bearing: full weight is permitted with brace locked in extension for 1 week. Then brace can be unlocked. Use crutches if there is pain and limping. Bending and straightening the knee is unlimited. Elevate knee above heart level as much as possible for one week. Avoid standing more than 5 minutes (consecutively) for the first week. No exercise involving the knee until cleared by the surgeon or physical therapist. Ideally, you should avoid long distance travel for 4 weeks.   5. Medications:  - You have been provided a prescription for narcotic pain medicine (oxycodone). After surgery, take 1-2 narcotic tablets every 4 hours if needed for severe pain.  - A prescription for anti-nausea medication (Zofran) will be provided in case the narcotic medicine causes nausea - take 1 tablet every 6 hours only if nauseated.  - Take ibuprofen 800 mg every 8 hours with food to reduce post-operative knee swelling. DO NOT STOP IBUPROFEN POST-OP UNTIL INSTRUCTED TO DO SO at first post-op office visit (10-14 days after surgery).  - Take enteric coated aspirin 325 mg once daily for 2 weeks to prevent blood clots.  -Take tylenol 1000 mg every 8 hours for pain.  May stop tylenol ~1-2 weeks after surgery if you are having minimal pain. - Take gabapentin 300 mg three times daily for 5 days - Take Valium 5mg  three times daily x 2 weeks. May stop if not having any  significant muscle spasms.    If you are taking prescription medication for anxiety, depression, insomnia, muscle spasm, chronic pain, or for attention deficit disorder you are advised that you are at a higher risk of adverse effects with use of narcotics post-op, including narcotic addiction/dependence, depressed breathing, death. If you use non-prescribed substances: alcohol, marijuana, cocaine, heroin, methamphetamines, etc., you are at a higher risk of adverse effects with use of narcotics post-op, including narcotic addiction/dependence, depressed breathing, death. You are advised that taking > 50 morphine milligram equivalents (MME) of narcotic pain medication per day results in twice the risk of overdose or death. For your prescription provided: oxycodone 5 mg - taking more than 6 tablets per day. Be advised that we will prescribe narcotics short-term, for acute post-operative pain only - 1 week for minor operations such as knee arthroscopy for meniscus tear resection, and 3 weeks for major operations such as knee repair/reconstruction surgeries.   6. Bandages: The physical therapist should change the bandages at the first post-op appointment. If needed, the dressing supplies have been provided to you.   7. Physical Therapy: 2 times per week for the first 4 weeks, then 1-2 times per week from weeks 4-8 post-op. Therapy typically starts within 1 week. You have been provided an order for physical therapy. The therapist will provide home exercises. Attending physical therapy and performing the appropriate exercises on your own at home daily will determine how well you do after this surgery. If  you do not know when your first physical therapy appointment is, please call the office at [phone].   8. Work/School: May return when able to tolerate standing for greater than 2 hours and off of narcotic pain medicaitons   9. Post-Op Appointments: Your first post-op appointment will be with Dr. Posey Pronto in  approximately 2 weeks time.    If you find that they have not been scheduled please call the Orthopaedic Appointment front desk at [phone].   AMBULATORY SURGERY  DISCHARGE INSTRUCTIONS   The drugs that you were given will stay in your system until tomorrow so for the next 24 hours you should not:  Drive an automobile Make any legal decisions Drink any alcoholic beverage   You may resume regular meals tomorrow.  Today it is better to start with liquids and gradually work up to solid foods.  You may eat anything you prefer, but it is better to start with liquids, then soup and crackers, and gradually work up to solid foods.   Please notify your doctor immediately if you have any unusual bleeding, trouble breathing, redness and pain at the surgery site, drainage, fever, or pain not relieved by medication.      Additional Instructions:

## 2022-04-30 NOTE — Anesthesia Procedure Notes (Signed)
Procedure Name: LMA Insertion Date/Time: 04/30/2022 7:45 AM  Performed by: Patience Musca., CRNAPre-anesthesia Checklist: Patient identified, Patient being monitored, Timeout performed, Emergency Drugs available and Suction available Patient Re-evaluated:Patient Re-evaluated prior to induction Oxygen Delivery Method: Circle system utilized Preoxygenation: Pre-oxygenation with 100% oxygen Induction Type: IV induction Ventilation: Mask ventilation without difficulty LMA: LMA inserted Laryngoscope Size: 4 Tube type: Oral Number of attempts: 1 Placement Confirmation: positive ETCO2 and breath sounds checked- equal and bilateral Tube secured with: Tape Dental Injury: Teeth and Oropharynx as per pre-operative assessment

## 2022-04-30 NOTE — Transfer of Care (Signed)
Immediate Anesthesia Transfer of Care Note  Patient: Lynn Booker  Procedure(s) Performed: Right arthroscopic ACL reconstruction using quadriceps tendon autograft (Right: Knee)  Patient Location: PACU  Anesthesia Type:General  Level of Consciousness: awake, alert , and oriented  Airway & Oxygen Therapy: Patient Spontanous Breathing  Post-op Assessment: Report given to RN, Post -op Vital signs reviewed and stable, and Patient moving all extremities X 4  Post vital signs: Reviewed and stable  Last Vitals:  Vitals Value Taken Time  BP 132/80 04/30/22 1016  Temp    Pulse 78 04/30/22 1019  Resp 11 04/30/22 1019  SpO2 99 % 04/30/22 1019  Vitals shown include unvalidated device data.  Last Pain:  Vitals:   04/30/22 0631  TempSrc: Temporal  PainSc: 0-No pain         Complications: No notable events documented.

## 2022-05-01 ENCOUNTER — Encounter: Payer: Self-pay | Admitting: Orthopedic Surgery

## 2022-05-04 DIAGNOSIS — M6281 Muscle weakness (generalized): Secondary | ICD-10-CM | POA: Diagnosis not present

## 2022-05-04 DIAGNOSIS — M25661 Stiffness of right knee, not elsewhere classified: Secondary | ICD-10-CM | POA: Diagnosis not present

## 2022-05-04 DIAGNOSIS — M25561 Pain in right knee: Secondary | ICD-10-CM | POA: Diagnosis not present

## 2022-05-06 DIAGNOSIS — M6281 Muscle weakness (generalized): Secondary | ICD-10-CM | POA: Diagnosis not present

## 2022-05-06 DIAGNOSIS — M25561 Pain in right knee: Secondary | ICD-10-CM | POA: Diagnosis not present

## 2022-05-06 DIAGNOSIS — M25661 Stiffness of right knee, not elsewhere classified: Secondary | ICD-10-CM | POA: Diagnosis not present

## 2022-05-07 NOTE — Anesthesia Postprocedure Evaluation (Signed)
Anesthesia Post Note  Patient: Lynn Booker  Procedure(s) Performed: Right arthroscopic ACL reconstruction using quadriceps tendon autograft (Right: Knee)  Patient location during evaluation: PACU Anesthesia Type: General Level of consciousness: awake and alert Pain management: pain level controlled Vital Signs Assessment: post-procedure vital signs reviewed and stable Respiratory status: spontaneous breathing, nonlabored ventilation, respiratory function stable and patient connected to nasal cannula oxygen Cardiovascular status: blood pressure returned to baseline and stable Postop Assessment: no apparent nausea or vomiting Anesthetic complications: no   There were no known notable events for this encounter.   Last Vitals:  Vitals:   04/30/22 1141 04/30/22 1159  BP: (!) 140/98 138/87  Pulse: 64 62  Resp: (!) 7 15  Temp: (!) 36.3 C (!) 36.3 C  SpO2: 100% 95%    Last Pain:  Vitals:   04/30/22 1159  TempSrc: Temporal  PainSc: 3                  Martha Clan

## 2022-05-11 DIAGNOSIS — M25661 Stiffness of right knee, not elsewhere classified: Secondary | ICD-10-CM | POA: Diagnosis not present

## 2022-05-11 DIAGNOSIS — M6281 Muscle weakness (generalized): Secondary | ICD-10-CM | POA: Diagnosis not present

## 2022-05-11 DIAGNOSIS — M25561 Pain in right knee: Secondary | ICD-10-CM | POA: Diagnosis not present

## 2022-05-14 DIAGNOSIS — M25661 Stiffness of right knee, not elsewhere classified: Secondary | ICD-10-CM | POA: Diagnosis not present

## 2022-05-14 DIAGNOSIS — M25561 Pain in right knee: Secondary | ICD-10-CM | POA: Diagnosis not present

## 2022-05-14 DIAGNOSIS — M6281 Muscle weakness (generalized): Secondary | ICD-10-CM | POA: Diagnosis not present

## 2022-05-15 DIAGNOSIS — Z23 Encounter for immunization: Secondary | ICD-10-CM | POA: Diagnosis not present

## 2022-05-15 DIAGNOSIS — Z6827 Body mass index (BMI) 27.0-27.9, adult: Secondary | ICD-10-CM | POA: Diagnosis not present

## 2022-05-15 DIAGNOSIS — Z0000 Encounter for general adult medical examination without abnormal findings: Secondary | ICD-10-CM | POA: Diagnosis not present

## 2022-05-15 DIAGNOSIS — Z713 Dietary counseling and surveillance: Secondary | ICD-10-CM | POA: Diagnosis not present

## 2022-05-15 DIAGNOSIS — Z7189 Other specified counseling: Secondary | ICD-10-CM | POA: Diagnosis not present

## 2022-05-15 DIAGNOSIS — Z113 Encounter for screening for infections with a predominantly sexual mode of transmission: Secondary | ICD-10-CM | POA: Diagnosis not present

## 2022-05-19 DIAGNOSIS — M25661 Stiffness of right knee, not elsewhere classified: Secondary | ICD-10-CM | POA: Diagnosis not present

## 2022-05-19 DIAGNOSIS — M6281 Muscle weakness (generalized): Secondary | ICD-10-CM | POA: Diagnosis not present

## 2022-05-19 DIAGNOSIS — M25561 Pain in right knee: Secondary | ICD-10-CM | POA: Diagnosis not present

## 2022-05-21 DIAGNOSIS — M25661 Stiffness of right knee, not elsewhere classified: Secondary | ICD-10-CM | POA: Diagnosis not present

## 2022-05-21 DIAGNOSIS — M6281 Muscle weakness (generalized): Secondary | ICD-10-CM | POA: Diagnosis not present

## 2022-05-21 DIAGNOSIS — M25561 Pain in right knee: Secondary | ICD-10-CM | POA: Diagnosis not present

## 2022-05-26 DIAGNOSIS — M6281 Muscle weakness (generalized): Secondary | ICD-10-CM | POA: Diagnosis not present

## 2022-05-26 DIAGNOSIS — M25661 Stiffness of right knee, not elsewhere classified: Secondary | ICD-10-CM | POA: Diagnosis not present

## 2022-05-26 DIAGNOSIS — M25561 Pain in right knee: Secondary | ICD-10-CM | POA: Diagnosis not present

## 2022-05-28 DIAGNOSIS — M6281 Muscle weakness (generalized): Secondary | ICD-10-CM | POA: Diagnosis not present

## 2022-05-28 DIAGNOSIS — M25661 Stiffness of right knee, not elsewhere classified: Secondary | ICD-10-CM | POA: Diagnosis not present

## 2022-05-28 DIAGNOSIS — M25561 Pain in right knee: Secondary | ICD-10-CM | POA: Diagnosis not present

## 2022-06-04 DIAGNOSIS — M6281 Muscle weakness (generalized): Secondary | ICD-10-CM | POA: Diagnosis not present

## 2022-06-04 DIAGNOSIS — M25661 Stiffness of right knee, not elsewhere classified: Secondary | ICD-10-CM | POA: Diagnosis not present

## 2022-06-04 DIAGNOSIS — M25561 Pain in right knee: Secondary | ICD-10-CM | POA: Diagnosis not present

## 2022-06-09 DIAGNOSIS — M6281 Muscle weakness (generalized): Secondary | ICD-10-CM | POA: Diagnosis not present

## 2022-06-09 DIAGNOSIS — M25661 Stiffness of right knee, not elsewhere classified: Secondary | ICD-10-CM | POA: Diagnosis not present

## 2022-06-09 DIAGNOSIS — M25561 Pain in right knee: Secondary | ICD-10-CM | POA: Diagnosis not present

## 2022-06-11 DIAGNOSIS — M25561 Pain in right knee: Secondary | ICD-10-CM | POA: Diagnosis not present

## 2022-06-11 DIAGNOSIS — S83511D Sprain of anterior cruciate ligament of right knee, subsequent encounter: Secondary | ICD-10-CM | POA: Diagnosis not present

## 2022-06-11 DIAGNOSIS — M25661 Stiffness of right knee, not elsewhere classified: Secondary | ICD-10-CM | POA: Diagnosis not present

## 2022-06-11 DIAGNOSIS — M6281 Muscle weakness (generalized): Secondary | ICD-10-CM | POA: Diagnosis not present

## 2022-06-16 DIAGNOSIS — M25661 Stiffness of right knee, not elsewhere classified: Secondary | ICD-10-CM | POA: Diagnosis not present

## 2022-06-16 DIAGNOSIS — M25561 Pain in right knee: Secondary | ICD-10-CM | POA: Diagnosis not present

## 2022-06-16 DIAGNOSIS — M6281 Muscle weakness (generalized): Secondary | ICD-10-CM | POA: Diagnosis not present

## 2022-06-23 DIAGNOSIS — M25561 Pain in right knee: Secondary | ICD-10-CM | POA: Diagnosis not present

## 2022-06-23 DIAGNOSIS — M6281 Muscle weakness (generalized): Secondary | ICD-10-CM | POA: Diagnosis not present

## 2022-06-23 DIAGNOSIS — M25661 Stiffness of right knee, not elsewhere classified: Secondary | ICD-10-CM | POA: Diagnosis not present

## 2022-06-25 DIAGNOSIS — M25661 Stiffness of right knee, not elsewhere classified: Secondary | ICD-10-CM | POA: Diagnosis not present

## 2022-06-25 DIAGNOSIS — M25561 Pain in right knee: Secondary | ICD-10-CM | POA: Diagnosis not present

## 2022-06-25 DIAGNOSIS — M6281 Muscle weakness (generalized): Secondary | ICD-10-CM | POA: Diagnosis not present

## 2022-06-30 DIAGNOSIS — M25661 Stiffness of right knee, not elsewhere classified: Secondary | ICD-10-CM | POA: Diagnosis not present

## 2022-06-30 DIAGNOSIS — M6281 Muscle weakness (generalized): Secondary | ICD-10-CM | POA: Diagnosis not present

## 2022-06-30 DIAGNOSIS — M25561 Pain in right knee: Secondary | ICD-10-CM | POA: Diagnosis not present

## 2022-07-06 DIAGNOSIS — M25561 Pain in right knee: Secondary | ICD-10-CM | POA: Diagnosis not present

## 2022-07-06 DIAGNOSIS — M6281 Muscle weakness (generalized): Secondary | ICD-10-CM | POA: Diagnosis not present

## 2022-07-06 DIAGNOSIS — M25661 Stiffness of right knee, not elsewhere classified: Secondary | ICD-10-CM | POA: Diagnosis not present

## 2022-07-09 DIAGNOSIS — M25661 Stiffness of right knee, not elsewhere classified: Secondary | ICD-10-CM | POA: Diagnosis not present

## 2022-07-09 DIAGNOSIS — M6281 Muscle weakness (generalized): Secondary | ICD-10-CM | POA: Diagnosis not present

## 2022-07-09 DIAGNOSIS — M25561 Pain in right knee: Secondary | ICD-10-CM | POA: Diagnosis not present

## 2022-07-13 DIAGNOSIS — M25561 Pain in right knee: Secondary | ICD-10-CM | POA: Diagnosis not present

## 2022-07-13 DIAGNOSIS — M6281 Muscle weakness (generalized): Secondary | ICD-10-CM | POA: Diagnosis not present

## 2022-07-13 DIAGNOSIS — M25661 Stiffness of right knee, not elsewhere classified: Secondary | ICD-10-CM | POA: Diagnosis not present

## 2022-07-15 DIAGNOSIS — M6281 Muscle weakness (generalized): Secondary | ICD-10-CM | POA: Diagnosis not present

## 2022-07-15 DIAGNOSIS — M25661 Stiffness of right knee, not elsewhere classified: Secondary | ICD-10-CM | POA: Diagnosis not present

## 2022-07-15 DIAGNOSIS — M25561 Pain in right knee: Secondary | ICD-10-CM | POA: Diagnosis not present

## 2022-07-22 DIAGNOSIS — M25661 Stiffness of right knee, not elsewhere classified: Secondary | ICD-10-CM | POA: Diagnosis not present

## 2022-07-22 DIAGNOSIS — M6281 Muscle weakness (generalized): Secondary | ICD-10-CM | POA: Diagnosis not present

## 2022-07-22 DIAGNOSIS — M25561 Pain in right knee: Secondary | ICD-10-CM | POA: Diagnosis not present

## 2022-08-03 DIAGNOSIS — M25661 Stiffness of right knee, not elsewhere classified: Secondary | ICD-10-CM | POA: Diagnosis not present

## 2022-08-03 DIAGNOSIS — M25561 Pain in right knee: Secondary | ICD-10-CM | POA: Diagnosis not present

## 2022-08-03 DIAGNOSIS — M6281 Muscle weakness (generalized): Secondary | ICD-10-CM | POA: Diagnosis not present

## 2022-08-10 DIAGNOSIS — M25661 Stiffness of right knee, not elsewhere classified: Secondary | ICD-10-CM | POA: Diagnosis not present

## 2022-08-10 DIAGNOSIS — M6281 Muscle weakness (generalized): Secondary | ICD-10-CM | POA: Diagnosis not present

## 2022-08-10 DIAGNOSIS — M25561 Pain in right knee: Secondary | ICD-10-CM | POA: Diagnosis not present

## 2022-08-12 DIAGNOSIS — M25561 Pain in right knee: Secondary | ICD-10-CM | POA: Diagnosis not present

## 2022-08-12 DIAGNOSIS — M25661 Stiffness of right knee, not elsewhere classified: Secondary | ICD-10-CM | POA: Diagnosis not present

## 2022-08-12 DIAGNOSIS — M6281 Muscle weakness (generalized): Secondary | ICD-10-CM | POA: Diagnosis not present

## 2022-08-17 DIAGNOSIS — M25561 Pain in right knee: Secondary | ICD-10-CM | POA: Diagnosis not present

## 2022-08-17 DIAGNOSIS — M6281 Muscle weakness (generalized): Secondary | ICD-10-CM | POA: Diagnosis not present

## 2022-08-17 DIAGNOSIS — M25661 Stiffness of right knee, not elsewhere classified: Secondary | ICD-10-CM | POA: Diagnosis not present

## 2022-08-19 DIAGNOSIS — M25561 Pain in right knee: Secondary | ICD-10-CM | POA: Diagnosis not present

## 2022-08-19 DIAGNOSIS — M6281 Muscle weakness (generalized): Secondary | ICD-10-CM | POA: Diagnosis not present

## 2022-08-19 DIAGNOSIS — M25661 Stiffness of right knee, not elsewhere classified: Secondary | ICD-10-CM | POA: Diagnosis not present

## 2022-08-27 DIAGNOSIS — S83511D Sprain of anterior cruciate ligament of right knee, subsequent encounter: Secondary | ICD-10-CM | POA: Diagnosis not present

## 2022-09-03 DIAGNOSIS — M25661 Stiffness of right knee, not elsewhere classified: Secondary | ICD-10-CM | POA: Diagnosis not present

## 2022-09-03 DIAGNOSIS — M25561 Pain in right knee: Secondary | ICD-10-CM | POA: Diagnosis not present

## 2022-09-03 DIAGNOSIS — M6281 Muscle weakness (generalized): Secondary | ICD-10-CM | POA: Diagnosis not present

## 2022-09-07 DIAGNOSIS — M25661 Stiffness of right knee, not elsewhere classified: Secondary | ICD-10-CM | POA: Diagnosis not present

## 2022-09-07 DIAGNOSIS — M25561 Pain in right knee: Secondary | ICD-10-CM | POA: Diagnosis not present

## 2022-09-07 DIAGNOSIS — M6281 Muscle weakness (generalized): Secondary | ICD-10-CM | POA: Diagnosis not present

## 2022-09-09 DIAGNOSIS — M25561 Pain in right knee: Secondary | ICD-10-CM | POA: Diagnosis not present

## 2022-09-09 DIAGNOSIS — M6281 Muscle weakness (generalized): Secondary | ICD-10-CM | POA: Diagnosis not present

## 2022-09-09 DIAGNOSIS — M25661 Stiffness of right knee, not elsewhere classified: Secondary | ICD-10-CM | POA: Diagnosis not present

## 2022-09-18 DIAGNOSIS — M25561 Pain in right knee: Secondary | ICD-10-CM | POA: Diagnosis not present

## 2022-09-18 DIAGNOSIS — M6281 Muscle weakness (generalized): Secondary | ICD-10-CM | POA: Diagnosis not present

## 2022-09-18 DIAGNOSIS — M25661 Stiffness of right knee, not elsewhere classified: Secondary | ICD-10-CM | POA: Diagnosis not present

## 2022-09-28 DIAGNOSIS — M6281 Muscle weakness (generalized): Secondary | ICD-10-CM | POA: Diagnosis not present

## 2022-09-28 DIAGNOSIS — M25561 Pain in right knee: Secondary | ICD-10-CM | POA: Diagnosis not present

## 2022-09-28 DIAGNOSIS — M25661 Stiffness of right knee, not elsewhere classified: Secondary | ICD-10-CM | POA: Diagnosis not present

## 2022-09-30 DIAGNOSIS — M25661 Stiffness of right knee, not elsewhere classified: Secondary | ICD-10-CM | POA: Diagnosis not present

## 2022-09-30 DIAGNOSIS — M25561 Pain in right knee: Secondary | ICD-10-CM | POA: Diagnosis not present

## 2022-09-30 DIAGNOSIS — M6281 Muscle weakness (generalized): Secondary | ICD-10-CM | POA: Diagnosis not present

## 2022-10-05 DIAGNOSIS — M25561 Pain in right knee: Secondary | ICD-10-CM | POA: Diagnosis not present

## 2022-10-05 DIAGNOSIS — M25661 Stiffness of right knee, not elsewhere classified: Secondary | ICD-10-CM | POA: Diagnosis not present

## 2022-10-05 DIAGNOSIS — M6281 Muscle weakness (generalized): Secondary | ICD-10-CM | POA: Diagnosis not present

## 2022-10-12 DIAGNOSIS — M25561 Pain in right knee: Secondary | ICD-10-CM | POA: Diagnosis not present

## 2022-10-12 DIAGNOSIS — M6281 Muscle weakness (generalized): Secondary | ICD-10-CM | POA: Diagnosis not present

## 2022-10-12 DIAGNOSIS — M25661 Stiffness of right knee, not elsewhere classified: Secondary | ICD-10-CM | POA: Diagnosis not present

## 2022-10-26 DIAGNOSIS — S83511D Sprain of anterior cruciate ligament of right knee, subsequent encounter: Secondary | ICD-10-CM | POA: Diagnosis not present

## 2022-10-27 DIAGNOSIS — M6281 Muscle weakness (generalized): Secondary | ICD-10-CM | POA: Diagnosis not present

## 2022-10-27 DIAGNOSIS — M25661 Stiffness of right knee, not elsewhere classified: Secondary | ICD-10-CM | POA: Diagnosis not present

## 2022-10-27 DIAGNOSIS — M25561 Pain in right knee: Secondary | ICD-10-CM | POA: Diagnosis not present

## 2022-11-04 DIAGNOSIS — M25661 Stiffness of right knee, not elsewhere classified: Secondary | ICD-10-CM | POA: Diagnosis not present

## 2022-11-04 DIAGNOSIS — M25561 Pain in right knee: Secondary | ICD-10-CM | POA: Diagnosis not present

## 2022-11-04 DIAGNOSIS — M6281 Muscle weakness (generalized): Secondary | ICD-10-CM | POA: Diagnosis not present

## 2022-11-09 DIAGNOSIS — M25561 Pain in right knee: Secondary | ICD-10-CM | POA: Diagnosis not present

## 2022-11-09 DIAGNOSIS — M6281 Muscle weakness (generalized): Secondary | ICD-10-CM | POA: Diagnosis not present

## 2022-11-09 DIAGNOSIS — M25661 Stiffness of right knee, not elsewhere classified: Secondary | ICD-10-CM | POA: Diagnosis not present

## 2022-11-12 DIAGNOSIS — M25661 Stiffness of right knee, not elsewhere classified: Secondary | ICD-10-CM | POA: Diagnosis not present

## 2022-11-12 DIAGNOSIS — M25561 Pain in right knee: Secondary | ICD-10-CM | POA: Diagnosis not present

## 2022-11-12 DIAGNOSIS — M6281 Muscle weakness (generalized): Secondary | ICD-10-CM | POA: Diagnosis not present

## 2022-11-19 DIAGNOSIS — M6281 Muscle weakness (generalized): Secondary | ICD-10-CM | POA: Diagnosis not present

## 2022-11-19 DIAGNOSIS — M25661 Stiffness of right knee, not elsewhere classified: Secondary | ICD-10-CM | POA: Diagnosis not present

## 2022-11-19 DIAGNOSIS — M25561 Pain in right knee: Secondary | ICD-10-CM | POA: Diagnosis not present

## 2022-11-23 DIAGNOSIS — M6281 Muscle weakness (generalized): Secondary | ICD-10-CM | POA: Diagnosis not present

## 2022-11-23 DIAGNOSIS — M25561 Pain in right knee: Secondary | ICD-10-CM | POA: Diagnosis not present

## 2022-11-23 DIAGNOSIS — M25661 Stiffness of right knee, not elsewhere classified: Secondary | ICD-10-CM | POA: Diagnosis not present

## 2022-11-26 DIAGNOSIS — M25661 Stiffness of right knee, not elsewhere classified: Secondary | ICD-10-CM | POA: Diagnosis not present

## 2022-11-26 DIAGNOSIS — M25561 Pain in right knee: Secondary | ICD-10-CM | POA: Diagnosis not present

## 2022-11-26 DIAGNOSIS — M6281 Muscle weakness (generalized): Secondary | ICD-10-CM | POA: Diagnosis not present

## 2022-12-17 DIAGNOSIS — M6281 Muscle weakness (generalized): Secondary | ICD-10-CM | POA: Diagnosis not present

## 2022-12-17 DIAGNOSIS — M25561 Pain in right knee: Secondary | ICD-10-CM | POA: Diagnosis not present

## 2022-12-17 DIAGNOSIS — M25661 Stiffness of right knee, not elsewhere classified: Secondary | ICD-10-CM | POA: Diagnosis not present

## 2022-12-17 DIAGNOSIS — S83511D Sprain of anterior cruciate ligament of right knee, subsequent encounter: Secondary | ICD-10-CM | POA: Diagnosis not present

## 2022-12-21 ENCOUNTER — Ambulatory Visit
Admission: EM | Admit: 2022-12-21 | Discharge: 2022-12-21 | Disposition: A | Discharge status: Home / Self Care | Payer: BC Managed Care – PPO | Attending: Physician Assistant | Admitting: Physician Assistant

## 2022-12-21 DIAGNOSIS — R002 Palpitations: Secondary | ICD-10-CM | POA: Diagnosis not present

## 2022-12-21 DIAGNOSIS — R5383 Other fatigue: Secondary | ICD-10-CM | POA: Diagnosis not present

## 2022-12-21 DIAGNOSIS — Z8349 Family history of other endocrine, nutritional and metabolic diseases: Secondary | ICD-10-CM | POA: Diagnosis not present

## 2022-12-21 LAB — TSH: TSH: 0.056 u[IU]/mL — ABNORMAL LOW (ref 0.350–4.500)

## 2022-12-21 NOTE — ED Triage Notes (Signed)
Patient presents today with mom. Lynn Booker states her heart beat was fast yesterday, headache and she have been more tired than usual.

## 2022-12-21 NOTE — ED Provider Notes (Signed)
[facility]    CSN: [id number] Arrival date & time: 12/21/22  1542      History   Chief Complaint No chief complaint on file.   HPI Lynn Booker is a 22 y.o. female presenting with mother for concerns about palpitations and fast heart rate that occurred yesterday and lasted for a few minutes.  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  She states she was not stressed or overly anxious.  She denies any caffeine consumption that day.  Patient says she had a headache and felt a little fatigued at that time.  Now she is only complaining of little bit of fatigue.  Other symptoms have resolved.  She never experienced any chest pain, shortness of breath, dizziness, weakness, presyncope or syncope.  No similar problems in the past.  There is a strong family history of Graves' disease and multiple family members and mother does have concerns about that.  No recent unintentional weight loss.  Patient is a wise healthy without any chronic medical problems.  HPI  Past Medical History:  Diagnosis Date   ACL injury tear 2023   No known health problems     There are no problems to display for this patient.   Past Surgical History:  Procedure Laterality Date   KNEE ARTHROSCOPY WITH ANTERIOR CRUCIATE LIGAMENT (ACL) REPAIR WITH HAMSTRING GRAFT Right 04/30/2022   Procedure: Right arthroscopic ACL reconstruction using quadriceps tendon autograft;  Surgeon: Signa Kell, MD;  Location: [facility] ORS;  Service: Orthopedics;  Laterality: Right;    OB History   No obstetric history on file.      Home Medications    Prior to Admission medications   Medication Sig Start Date End Date Taking? Authorizing Provider  acetaminophen (TYLENOL) 500 MG tablet Take 2 tablets (1,000 mg total) by mouth every 8 (eight) hours. 04/30/22 04/30/23  Signa Kell, MD  diazepam (VALIUM) 5 MG tablet Take 1 tablet (5 mg total) by mouth every 8 (eight) hours as needed for muscle spasms. 04/30/22 04/30/23  Signa Kell, MD  gabapentin (NEURONTIN) 300 MG capsule Take 1 capsule (300 mg total) by mouth 3 (three) times daily for 5 days. 04/30/22 05/05/22  Signa Kell, MD  norgestimate-ethinyl estradiol (ORTHO-CYCLEN) 0.25-35 MG-MCG tablet Take 1 tablet by mouth daily.    [provider]  ondansetron (ZOFRAN-ODT) 4 MG disintegrating tablet Take 1 tablet (4 mg total) by mouth every 8 (eight) hours as needed for nausea or vomiting. 04/30/22   Signa Kell, MD  oxyCODONE (ROXICODONE) 5 MG immediate release tablet Take 1-2 tablets (5-10 mg total) by mouth every 4 (four) hours as needed (pain). 04/30/22 04/30/23  Signa Kell, MD    Family History Family History  Problem Relation Age of Onset   Thyroid disease Mother    Multiple sclerosis Mother    Hypertension Father    Diabetes Father     Social History Social History   Tobacco Use   Smoking status: Never   Smokeless tobacco: Never  Vaping Use   Vaping status: Never Used  Substance Use Topics   Alcohol use: No   Drug use: No     Allergies   Patient has no known allergies.   Review of Systems Review of Systems  Constitutional:  Positive for fatigue. Negative for chills, diaphoresis and fever.  HENT:  Negative for congestion, rhinorrhea and sore throat.   Respiratory:  Negative for cough, shortness of breath and wheezing.   Cardiovascular:  Positive for  palpitations. Negative for chest pain.  Gastrointestinal:  Negative for abdominal pain, nausea and vomiting.  Musculoskeletal:  Negative for arthralgias and myalgias.  Skin:  Negative for rash.  Neurological:  Positive for headaches. Negative for dizziness and weakness.  Hematological:  Negative for adenopathy.     Physical Exam Triage Vital Signs ED Triage Vitals  Encounter Vitals Group     BP 12/21/22 1710 122/78     Systolic BP Percentile --      Diastolic BP Percentile --      Pulse Rate 12/21/22 1710 86     Resp --      Temp 12/21/22 1710 98.3 F (36.8 C)     Temp Source  12/21/22 1710 Oral     SpO2 12/21/22 1710 100 %     Weight 12/21/22 1713 153 lb (69.4 kg)     Height 12/21/22 1713 5\' 5"  (1.651 m)     Head Circumference --      Peak Flow --      Pain Score 12/21/22 1713 0     Pain Loc --      Pain Education --      Exclude from Growth Chart --    No data found.  Updated Vital Signs BP 122/78 (BP Location: Left Arm)   Pulse 86   Temp 98.3 F (36.8 C) (Oral)   Ht 5\' 5"  (1.651 m)   Wt 153 lb (69.4 kg)   LMP 12/16/2022   SpO2 100%   BMI 25.46 kg/m      Physical Exam Vitals and nursing note reviewed.  Constitutional:      General: She is not in acute distress.    Appearance: Normal appearance. She is not ill-appearing or toxic-appearing.  HENT:     Head: Normocephalic and atraumatic.     Nose: Nose normal.     Mouth/Throat:     Mouth: Mucous membranes are moist.     Pharynx: Oropharynx is clear.  Eyes:     General: No scleral icterus.       Right eye: No discharge.        Left eye: No discharge.     Conjunctiva/sclera: Conjunctivae normal.  Cardiovascular:     Rate and Rhythm: Normal rate and regular rhythm.     Heart sounds: Normal heart sounds.  Pulmonary:     Effort: Pulmonary effort is normal. No respiratory distress.     Breath sounds: Normal breath sounds.  Musculoskeletal:     Cervical back: Neck supple.  Skin:    General: Skin is dry.  Neurological:     General: No focal deficit present.     Mental Status: She is alert. Mental status is at baseline.     Motor: No weakness.     Gait: Gait normal.  Psychiatric:        Mood and Affect: Mood normal.        Behavior: Behavior normal.        Thought Content: Thought content normal.      UC Treatments / Results  Labs (all labs ordered are listed, but only abnormal results are displayed) Labs Reviewed  TSH    EKG   Radiology No results found.  Procedures ED EKG  Date/Time: 12/21/2022 5:49 PM  Performed by: Shirlee Latch, PA-C Authorized by: Shirlee Latch, PA-C   Interpretation:    Interpretation: normal   Rate:    ECG rate:  79   ECG rate assessment: normal  Rhythm:    Rhythm: sinus rhythm   Ectopy:    Ectopy: none   QRS:    QRS axis:  Normal   QRS intervals:  Normal   QRS conduction: normal   ST segments:    ST segments:  Normal T waves:    T waves: normal   Comments:     Normal sinus rhythm. Regular rate.  (including critical care time)  Medications Ordered in UC Medications - No data to display  Initial Impression / Assessment and Plan / UC Course  I have reviewed the triage vital signs and the nursing notes.  Pertinent labs & imaging results that were available during my care of the patient were reviewed by me and considered in my medical decision making (see chart for details).   22 year old female presents for episode of palpitations and tachycardia (unsure how fast heartbeat was) the lasted for a few minutes with associated fatigue and headaches yesterday.  Mild fatigue now.  Vitals are all normal and stable and exam is normal.  Chest clear to auscultation heart regular rate and rhythm.  No obvious thyroid enlargement.  EKG performed in normal sinus rhythm and regular rate.  TSH obtained.  Explained that we will contact him with the results when they return.  Supportive care encouraged at this time.  Reviewed ED precautions.   Final Clinical Impressions(s) / UC Diagnoses   Final diagnoses:  Palpitations  Other fatigue  Family history of Graves' disease     Discharge Instructions      -Those are all normal and stable.  The EKG is normal. - Testing thyroid function given family history of Graves' disease.  Those results to be back tomorrow.  We will contact you with any abnormals.  Results will also come through MyChart. - If there are any other episodes of sustained palpitations or associated dizziness, chest pain, weakness, falls, etc. please go to the ER.     ED Prescriptions   None     PDMP not reviewed this encounter.   Shirlee Latch, PA-C 12/21/22 1751

## 2022-12-21 NOTE — Discharge Instructions (Signed)
-  Those are all normal and stable.  The EKG is normal. - Testing thyroid function given family history of Graves' disease.  Those results to be back tomorrow.  We will contact you with any abnormals.  Results will also come through MyChart. - If there are any other episodes of sustained palpitations or associated dizziness, chest pain, weakness, falls, etc. please go to the ER.

## 2022-12-22 ENCOUNTER — Telehealth: Payer: Self-pay | Admitting: Physician Assistant

## 2022-12-22 DIAGNOSIS — M6281 Muscle weakness (generalized): Secondary | ICD-10-CM | POA: Diagnosis not present

## 2022-12-22 DIAGNOSIS — M25661 Stiffness of right knee, not elsewhere classified: Secondary | ICD-10-CM | POA: Diagnosis not present

## 2022-12-22 DIAGNOSIS — M25561 Pain in right knee: Secondary | ICD-10-CM | POA: Diagnosis not present

## 2022-12-22 NOTE — Telephone Encounter (Signed)
Low TSH.  History of Graves' disease.  Ordered free T4, free T3 and thyroid antibody.  If abnormal findings will refer to endocrinology.  If no other significant abnormal findings will have patient follow-up with PCP.

## 2022-12-24 DIAGNOSIS — M6281 Muscle weakness (generalized): Secondary | ICD-10-CM | POA: Diagnosis not present

## 2022-12-24 DIAGNOSIS — M25661 Stiffness of right knee, not elsewhere classified: Secondary | ICD-10-CM | POA: Diagnosis not present

## 2022-12-24 DIAGNOSIS — M25561 Pain in right knee: Secondary | ICD-10-CM | POA: Diagnosis not present

## 2022-12-29 DIAGNOSIS — M6281 Muscle weakness (generalized): Secondary | ICD-10-CM | POA: Diagnosis not present

## 2022-12-29 DIAGNOSIS — M25661 Stiffness of right knee, not elsewhere classified: Secondary | ICD-10-CM | POA: Diagnosis not present

## 2022-12-29 DIAGNOSIS — M25561 Pain in right knee: Secondary | ICD-10-CM | POA: Diagnosis not present

## 2022-12-31 DIAGNOSIS — M25561 Pain in right knee: Secondary | ICD-10-CM | POA: Diagnosis not present

## 2022-12-31 DIAGNOSIS — M6281 Muscle weakness (generalized): Secondary | ICD-10-CM | POA: Diagnosis not present

## 2022-12-31 DIAGNOSIS — Z8349 Family history of other endocrine, nutritional and metabolic diseases: Secondary | ICD-10-CM | POA: Diagnosis not present

## 2022-12-31 DIAGNOSIS — R002 Palpitations: Secondary | ICD-10-CM | POA: Diagnosis not present

## 2022-12-31 DIAGNOSIS — M25661 Stiffness of right knee, not elsewhere classified: Secondary | ICD-10-CM | POA: Diagnosis not present

## 2022-12-31 DIAGNOSIS — R000 Tachycardia, unspecified: Secondary | ICD-10-CM | POA: Diagnosis not present

## 2023-01-04 DIAGNOSIS — M6281 Muscle weakness (generalized): Secondary | ICD-10-CM | POA: Diagnosis not present

## 2023-01-04 DIAGNOSIS — M25561 Pain in right knee: Secondary | ICD-10-CM | POA: Diagnosis not present

## 2023-01-04 DIAGNOSIS — M25661 Stiffness of right knee, not elsewhere classified: Secondary | ICD-10-CM | POA: Diagnosis not present

## 2023-01-11 DIAGNOSIS — M25561 Pain in right knee: Secondary | ICD-10-CM | POA: Diagnosis not present

## 2023-01-11 DIAGNOSIS — M6281 Muscle weakness (generalized): Secondary | ICD-10-CM | POA: Diagnosis not present

## 2023-01-11 DIAGNOSIS — M25661 Stiffness of right knee, not elsewhere classified: Secondary | ICD-10-CM | POA: Diagnosis not present

## 2023-01-13 DIAGNOSIS — M6281 Muscle weakness (generalized): Secondary | ICD-10-CM | POA: Diagnosis not present

## 2023-01-13 DIAGNOSIS — M25661 Stiffness of right knee, not elsewhere classified: Secondary | ICD-10-CM | POA: Diagnosis not present

## 2023-01-13 DIAGNOSIS — M25561 Pain in right knee: Secondary | ICD-10-CM | POA: Diagnosis not present

## 2023-01-20 DIAGNOSIS — M25661 Stiffness of right knee, not elsewhere classified: Secondary | ICD-10-CM | POA: Diagnosis not present

## 2023-01-20 DIAGNOSIS — M6281 Muscle weakness (generalized): Secondary | ICD-10-CM | POA: Diagnosis not present

## 2023-01-20 DIAGNOSIS — M25561 Pain in right knee: Secondary | ICD-10-CM | POA: Diagnosis not present

## 2023-01-26 DIAGNOSIS — M6281 Muscle weakness (generalized): Secondary | ICD-10-CM | POA: Diagnosis not present

## 2023-01-26 DIAGNOSIS — M25561 Pain in right knee: Secondary | ICD-10-CM | POA: Diagnosis not present

## 2023-01-26 DIAGNOSIS — M25661 Stiffness of right knee, not elsewhere classified: Secondary | ICD-10-CM | POA: Diagnosis not present

## 2023-01-28 DIAGNOSIS — S83511D Sprain of anterior cruciate ligament of right knee, subsequent encounter: Secondary | ICD-10-CM | POA: Diagnosis not present

## 2023-01-29 DIAGNOSIS — M25661 Stiffness of right knee, not elsewhere classified: Secondary | ICD-10-CM | POA: Diagnosis not present

## 2023-01-29 DIAGNOSIS — M25561 Pain in right knee: Secondary | ICD-10-CM | POA: Diagnosis not present

## 2023-01-29 DIAGNOSIS — M6281 Muscle weakness (generalized): Secondary | ICD-10-CM | POA: Diagnosis not present

## 2023-02-03 DIAGNOSIS — E0590 Thyrotoxicosis, unspecified without thyrotoxic crisis or storm: Secondary | ICD-10-CM | POA: Diagnosis not present

## 2023-02-03 DIAGNOSIS — R002 Palpitations: Secondary | ICD-10-CM | POA: Diagnosis not present

## 2023-02-09 DIAGNOSIS — M6281 Muscle weakness (generalized): Secondary | ICD-10-CM | POA: Diagnosis not present

## 2023-02-09 DIAGNOSIS — M25661 Stiffness of right knee, not elsewhere classified: Secondary | ICD-10-CM | POA: Diagnosis not present

## 2023-02-09 DIAGNOSIS — M25561 Pain in right knee: Secondary | ICD-10-CM | POA: Diagnosis not present

## 2023-02-25 DIAGNOSIS — M25661 Stiffness of right knee, not elsewhere classified: Secondary | ICD-10-CM | POA: Diagnosis not present

## 2023-02-25 DIAGNOSIS — M25561 Pain in right knee: Secondary | ICD-10-CM | POA: Diagnosis not present

## 2023-02-25 DIAGNOSIS — M6281 Muscle weakness (generalized): Secondary | ICD-10-CM | POA: Diagnosis not present

## 2023-03-08 DIAGNOSIS — M25661 Stiffness of right knee, not elsewhere classified: Secondary | ICD-10-CM | POA: Diagnosis not present

## 2023-03-08 DIAGNOSIS — M25561 Pain in right knee: Secondary | ICD-10-CM | POA: Diagnosis not present

## 2023-03-08 DIAGNOSIS — M6281 Muscle weakness (generalized): Secondary | ICD-10-CM | POA: Diagnosis not present

## 2023-03-09 NOTE — Progress Notes (Unsigned)
New patient visit   Patient: Lynn Booker   DOB: 2000-07-20   22 y.o. Female  MRN: [id number] Visit Date: 03/10/2023  Today's healthcare provider: Ronnald Ramp, MD   No chief complaint on file.  Subjective    Lynn Booker is a 22 y.o. female who presents today as a new patient to establish care.      Discussed the use of AI scribe software for clinical note transcription with the patient, who gave verbal consent to proceed.  History of Present Illness              Past Medical History:  Diagnosis Date   ACL injury tear 2023   No known health problems     Outpatient Medications Prior to Visit  Medication Sig   acetaminophen (TYLENOL) 500 MG tablet Take 2 tablets (1,000 mg total) by mouth every 8 (eight) hours.   diazepam (VALIUM) 5 MG tablet Take 1 tablet (5 mg total) by mouth every 8 (eight) hours as needed for muscle spasms.   gabapentin (NEURONTIN) 300 MG capsule Take 1 capsule (300 mg total) by mouth 3 (three) times daily for 5 days.   norgestimate-ethinyl estradiol (ORTHO-CYCLEN) 0.25-35 MG-MCG tablet Take 1 tablet by mouth daily.   ondansetron (ZOFRAN-ODT) 4 MG disintegrating tablet Take 1 tablet (4 mg total) by mouth every 8 (eight) hours as needed for nausea or vomiting.   oxyCODONE (ROXICODONE) 5 MG immediate release tablet Take 1-2 tablets (5-10 mg total) by mouth every 4 (four) hours as needed (pain).   No facility-administered medications prior to visit.    Past Surgical History:  Procedure Laterality Date   KNEE ARTHROSCOPY WITH ANTERIOR CRUCIATE LIGAMENT (ACL) REPAIR WITH HAMSTRING GRAFT Right 04/30/2022   Procedure: Right arthroscopic ACL reconstruction using quadriceps tendon autograft;  Surgeon: Signa Kell, MD;  Location: [facility] OR[facility];  Service: Orthopedics;  Laterality: Right;   Family Status  Relation Name Status   Mother  Alive   Father  Alive  No partnership data on file   Family History  Problem Relation Age of Onset    Thyroid disease Mother    Multiple sclerosis Mother    Hypertension Father    Diabetes Father    Social History   Socioeconomic History   Marital status: Single    Spouse name: Not on file   Number of children: Not on file   Years of education: Not on file   Highest education level: Not on file  Occupational History   Not on file  Tobacco Use   Smoking status: Never   Smokeless tobacco: Never  Vaping Use   Vaping status: Never Used  Substance and Sexual Activity   Alcohol use: No   Drug use: No   Sexual activity: Not on file  Other Topics Concern   Not on file  Social History Narrative   ** Merged History Encounter **       Lives in [place] dorm during school year   Social Determinants of Health   Financial Resource Strain: Low Risk  (02/02/2023)   Received from [facility]   Overall Financial Resource Strain (CARDIA)    Difficulty of Paying Living Expenses: Not hard at all  Food Insecurity: No Food Insecurity (02/02/2023)   Received [facility]   Hunger Vital Sign    Worried About Running Out of Food in the Last Year: Never true    Ran Out of Food in the Last Year: Never  true  Transportation Needs: No Transportation Needs (02/02/2023)   Received from [facility] - Transportation    In the past 12 months, has lack of transportation kept you from medical appointments or from getting medications?: No    Lack of Transportation (Non-Medical): No  Physical Activity: Not on file  Stress: Not on file  Social Connections: Not on file     No Known Allergies   There is no immunization history on file for this patient.  Health Maintenance  Topic Date Due   HPV VACCINES (1 - 3-dose series) Never done   HIV Screening  Never done   Hepatitis C Screening  Never done   DTaP/Tdap/Td (1 - Tdap) Never done   INFLUENZA VACCINE  Never done   COVID-19 Vaccine (2 - 2023-24 season) 12/27/2022    Patient Care  Team: [facility], [facility] as PCP - General (Family Medicine)  Review of Systems  {Insert previous labs (optional):23779} {See past labs  Heme  Chem  Endocrine  Serology  Results Review (optional):1}   Objective    There were no vitals taken for this visit. {Insert last BP/Wt (optional):23777}{See vitals history (optional):1}    Depression Screen     No data to display         No results found for any visits on 03/10/23.   Physical Exam ***    Assessment & Plan      Problem List Items Addressed This Visit   None   Assessment and Plan               No follow-ups on file.      Ronnald Ramp, MD  [facility] [phone] (phone) [phone] (fax)  [facility]

## 2023-03-10 ENCOUNTER — Encounter: Payer: Self-pay | Admitting: Family Medicine

## 2023-03-10 ENCOUNTER — Ambulatory Visit (INDEPENDENT_AMBULATORY_CARE_PROVIDER_SITE_OTHER): Payer: BC Managed Care – PPO | Admitting: Family Medicine

## 2023-03-10 VITALS — BP 122/81 | HR 59 | Temp 98.2°F | Resp 16 | Ht 64.0 in | Wt 163.8 lb

## 2023-03-10 DIAGNOSIS — D508 Other iron deficiency anemias: Secondary | ICD-10-CM | POA: Diagnosis not present

## 2023-03-10 DIAGNOSIS — Z30011 Encounter for initial prescription of contraceptive pills: Secondary | ICD-10-CM

## 2023-03-10 MED ORDER — NORGESTIMATE-ETH ESTRADIOL 0.25-35 MG-MCG PO TABS: 1.0000 | ORAL_TABLET | Freq: Every day | ORAL | 3 refills | Status: DC

## 2023-03-10 NOTE — Patient Instructions (Signed)
VISIT SUMMARY:  Today, we discussed your history of iron deficiency anemia, thyroid function, and general health maintenance. Your iron levels are improving, and your thyroid function is currently normal. We also talked about the importance of regular health check-ups and screenings.  YOUR PLAN:  -IRON DEFICIENCY ANEMIA: Iron deficiency anemia is a condition where your body doesn't have enough iron to produce adequate red blood cells, leading to symptoms like fatigue and heart palpitations. Continue taking ferrous sulfate 325 mg over-the-counter, monitor your iron levels periodically, and include iron-rich foods in your diet such as spinach, kale, collard greens, and red meats.  -THYROID DYSFUNCTION: Thyroid dysfunction refers to abnormal levels of thyroid hormones, which can affect your metabolism. Your thyroid function is currently normal, but we will follow up with the endocrinologist in January to re-evaluate.  -GENERAL HEALTH MAINTENANCE: Maintaining your general health involves regular check-ups and screenings. Schedule an annual physical exam in January or February, which will include blood work to screen for cholesterol problems, diabetes, and monitor your blood levels. Also, plan to begin cervical cancer screening with a Pap smear at age 64.  INSTRUCTIONS:  Please schedule your annual physical exam in January or February. Follow up with your endocrinologist in January for a re-evaluation of your thyroid function. Continue taking your iron supplements and include iron-rich foods in your diet. If you experience any new symptoms or health concerns, schedule a follow-up appointment with your primary care physician.

## 2023-03-11 ENCOUNTER — Encounter: Payer: Self-pay | Admitting: Family Medicine

## 2023-03-11 DIAGNOSIS — D509 Iron deficiency anemia, unspecified: Secondary | ICD-10-CM | POA: Insufficient documentation

## 2023-03-11 NOTE — Assessment & Plan Note (Signed)
Chronic iron deficiency anemia, previously treated with ferrous sulfate 325 mg. Ferritin was low at 11 in September. Symptoms of palpitations resolved with iron supplementation. Discussed the importance of maintaining iron levels to prevent symptoms such as fatigue and palpitations. Explained that iron deficiency can cause the heart to pump harder to supply oxygen, leading to palpitations. Discussed dietary sources of iron and the need to monitor iron levels periodically. - Continue ferrous sulfate 325 mg OTC - Monitor iron levels periodically - Encourage dietary intake of iron-rich foods (e.g., spinach, kale, collard greens, red meats)

## 2023-03-15 DIAGNOSIS — M25561 Pain in right knee: Secondary | ICD-10-CM | POA: Diagnosis not present

## 2023-03-15 DIAGNOSIS — M25661 Stiffness of right knee, not elsewhere classified: Secondary | ICD-10-CM | POA: Diagnosis not present

## 2023-03-15 DIAGNOSIS — M6281 Muscle weakness (generalized): Secondary | ICD-10-CM | POA: Diagnosis not present

## 2023-03-29 DIAGNOSIS — M25661 Stiffness of right knee, not elsewhere classified: Secondary | ICD-10-CM | POA: Diagnosis not present

## 2023-03-29 DIAGNOSIS — M6281 Muscle weakness (generalized): Secondary | ICD-10-CM | POA: Diagnosis not present

## 2023-03-29 DIAGNOSIS — M25561 Pain in right knee: Secondary | ICD-10-CM | POA: Diagnosis not present

## 2023-05-17 DIAGNOSIS — Z0000 Encounter for general adult medical examination without abnormal findings: Secondary | ICD-10-CM | POA: Insufficient documentation

## 2023-05-17 NOTE — Progress Notes (Deleted)
Complete physical exam   Patient: Lynn Booker   DOB: 09-19-2000   23 y.o. Female  MRN: [id number] Visit Date: 05/18/2023  Today's healthcare provider: Ronnald Ramp, MD   No chief complaint on file.  Subjective    Lynn Booker is a 23 y.o. female who presents today for a complete physical exam.   She reports consuming a {diet types:17450} diet.   {Exercise:19826}   She generally feels {well/fairly well/poorly:18703}.   She reports sleeping {well/fairly well/poorly:18703}.    She {does/does not:200015} have additional problems to discuss today.   Discussed the use of AI scribe software for clinical note transcription with the patient, who gave verbal consent to proceed.  History of Present Illness             Past Medical History:  Diagnosis Date   ACL injury tear 2023   Lumbar radiculopathy 10/01/2021   No known health problems    Past Surgical History:  Procedure Laterality Date   KNEE ARTHROSCOPY WITH ANTERIOR CRUCIATE LIGAMENT (ACL) REPAIR WITH HAMSTRING GRAFT Right 04/30/2022   Procedure: Right arthroscopic ACL reconstruction using quadriceps tendon autograft;  Surgeon: Signa Kell, MD;  Location: [facility] OR[facility];  Service: Orthopedics;  Laterality: Right;   Social History   Socioeconomic History   Marital status: Single    Spouse name: Not on file   Number of children: Not on file   Years of education: Not on file   Highest education level: Not on file  Occupational History   Not on file  Tobacco Use   Smoking status: Never   Smokeless tobacco: Never  Vaping Use   Vaping status: Never Used  Substance and Sexual Activity   Alcohol use: No   Drug use: No   Sexual activity: Not on file  Other Topics Concern   Not on file  Social History Narrative   ** Merged History Encounter **       Lives in [place] dorm during school year   Social Drivers of Health   Financial Resource Strain: Low Risk  (02/02/2023)   Received from [facility]   Overall Financial Resource Strain (CARDIA)    Difficulty of Paying Living Expenses: Not hard at all  Food Insecurity: No Food Insecurity (02/02/2023)   Received from [facility]   Hunger Vital Sign    Worried About Running Out of Food in the Last Year: Never true    Ran Out of Food in the Last Year: Never true  Transportation Needs: No Transportation Needs (02/02/2023)   Received from [facility] - Transportation    In the past 12 months, has lack of transportation kept you from medical appointments or from getting medications?: No    Lack of Transportation (Non-Medical): No  Physical Activity: Not on file  Stress: Not on file  Social Connections: Not on file  Intimate Partner Violence: Not on file   Family Status  Relation Name Status   Mother  Alive   Father  Alive   Mat Uncle  (Not Specified)   [employer]  (Not Specified)   Pat Uncle  Alive   MGM  (Not Specified)   PGF  (Not Specified)  No partnership data on file   Family History  Problem Relation Age of Onset   Thyroid disease Mother    Multiple sclerosis Mother    Hypertension Father    Diabetes Father    Thyroid disease  Maternal Uncle    Hypertension Maternal Uncle    Diabetes Paternal Aunt    Diabetes Paternal Uncle    Diabetes Maternal Grandmother    Hypertension Maternal Grandmother    Diabetes Paternal Grandfather    No Known Allergies   Medications: Outpatient Medications Prior to Visit  Medication Sig   ferrous sulfate 325 (65 FE) MG tablet Take 325 mg by mouth daily with breakfast.   norgestimate-ethinyl estradiol (ORTHO-CYCLEN) 0.25-35 MG-MCG tablet Take 1 tablet by mouth daily.   No facility-administered medications prior to visit.    Review of Systems  {Insert previous labs (optional):23779} {See past labs  Heme  Chem  Endocrine  Serology  Results Review (optional):1}  Objective    There were no vitals taken for this  visit. {Insert last BP/Wt (optional):23777}{See vitals history (optional):1}    Physical Exam  ***  Last depression screening scores    03/10/2023    4:11 PM  PHQ 2/9 Scores  PHQ - 2 Score 0  PHQ- 9 Score 4    Last fall risk screening    03/10/2023    4:11 PM  Fall Risk   Falls in the past year? 1  Number falls in past yr: 0  Injury with Fall? 1    Last Audit-C alcohol use screening     No data to display         A score of 3 or more in women, and 4 or more in men indicates increased risk for alcohol abuse, EXCEPT if all of the points are from question 1   No results found for any visits on 05/18/23.  Assessment & Plan    Routine Health Maintenance and Physical Exam  Immunization History  Administered Date(s) Administered   Moderna Covid-19 Vaccine Bivalent Booster 30yrs & up 11/26/2019    Health Maintenance  Topic Date Due   HPV VACCINES (1 - 3-dose series) Never done   HIV Screening  Never done   Hepatitis C Screening  Never done   DTaP/Tdap/Td (1 - Tdap) Never done   COVID-19 Vaccine (2 - 2024-25 season) 12/27/2022   Cervical Cancer Screening (Pap smear)  Never done   INFLUENZA VACCINE  07/26/2023 (Originally 11/26/2022)    Problem List Items Addressed This Visit   None   Assessment and Plan                No follow-ups on file.       Ronnald Ramp, MD  [facility] [phone] (phone) [phone] (fax)  [facility]

## 2023-05-18 ENCOUNTER — Encounter: Payer: BC Managed Care – PPO | Admitting: Family Medicine

## 2023-05-18 DIAGNOSIS — D508 Other iron deficiency anemias: Secondary | ICD-10-CM

## 2023-05-18 DIAGNOSIS — Z13220 Encounter for screening for lipoid disorders: Secondary | ICD-10-CM

## 2023-05-18 DIAGNOSIS — Z114 Encounter for screening for human immunodeficiency virus [HIV]: Secondary | ICD-10-CM

## 2023-05-18 DIAGNOSIS — Z124 Encounter for screening for malignant neoplasm of cervix: Secondary | ICD-10-CM

## 2023-05-18 DIAGNOSIS — Z131 Encounter for screening for diabetes mellitus: Secondary | ICD-10-CM

## 2023-05-18 DIAGNOSIS — Z1159 Encounter for screening for other viral diseases: Secondary | ICD-10-CM

## 2023-05-18 DIAGNOSIS — Z0000 Encounter for general adult medical examination without abnormal findings: Secondary | ICD-10-CM

## 2023-05-24 DIAGNOSIS — E0590 Thyrotoxicosis, unspecified without thyrotoxic crisis or storm: Secondary | ICD-10-CM | POA: Diagnosis not present

## 2023-06-10 ENCOUNTER — Ambulatory Visit (INDEPENDENT_AMBULATORY_CARE_PROVIDER_SITE_OTHER): Payer: BC Managed Care – PPO | Admitting: Family Medicine

## 2023-06-10 ENCOUNTER — Encounter: Payer: Self-pay | Admitting: Family Medicine

## 2023-06-10 ENCOUNTER — Other Ambulatory Visit (HOSPITAL_COMMUNITY)
Admission: RE | Admit: 2023-06-10 | Discharge: 2023-06-10 | Disposition: A | Discharge status: Home / Self Care | Payer: BC Managed Care – PPO | Source: Ambulatory Visit | Attending: Family Medicine | Admitting: Family Medicine

## 2023-06-10 VITALS — BP 111/64 | HR 73 | Temp 99.2°F | Resp 16 | Wt 162.2 lb

## 2023-06-10 DIAGNOSIS — Z114 Encounter for screening for human immunodeficiency virus [HIV]: Secondary | ICD-10-CM | POA: Diagnosis not present

## 2023-06-10 DIAGNOSIS — Z124 Encounter for screening for malignant neoplasm of cervix: Secondary | ICD-10-CM | POA: Insufficient documentation

## 2023-06-10 DIAGNOSIS — N760 Acute vaginitis: Secondary | ICD-10-CM | POA: Diagnosis not present

## 2023-06-10 DIAGNOSIS — Z13220 Encounter for screening for lipoid disorders: Secondary | ICD-10-CM | POA: Diagnosis not present

## 2023-06-10 DIAGNOSIS — Z0000 Encounter for general adult medical examination without abnormal findings: Secondary | ICD-10-CM

## 2023-06-10 DIAGNOSIS — Z113 Encounter for screening for infections with a predominantly sexual mode of transmission: Secondary | ICD-10-CM | POA: Diagnosis not present

## 2023-06-10 DIAGNOSIS — Z0001 Encounter for general adult medical examination with abnormal findings: Secondary | ICD-10-CM | POA: Diagnosis not present

## 2023-06-10 DIAGNOSIS — Z131 Encounter for screening for diabetes mellitus: Secondary | ICD-10-CM

## 2023-06-10 DIAGNOSIS — Z6827 Body mass index (BMI) 27.0-27.9, adult: Secondary | ICD-10-CM

## 2023-06-10 DIAGNOSIS — E663 Overweight: Secondary | ICD-10-CM | POA: Diagnosis not present

## 2023-06-10 DIAGNOSIS — B9689 Other specified bacterial agents as the cause of diseases classified elsewhere: Secondary | ICD-10-CM

## 2023-06-10 DIAGNOSIS — Z1159 Encounter for screening for other viral diseases: Secondary | ICD-10-CM

## 2023-06-10 DIAGNOSIS — D508 Other iron deficiency anemias: Secondary | ICD-10-CM

## 2023-06-10 NOTE — Progress Notes (Signed)
Complete physical exam   Patient: Lynn Booker   DOB: March 10, 2001   23 y.o. Female  MRN: [id number] Visit Date: 06/10/2023  Today's healthcare provider: Ronnald Ramp, MD   No chief complaint on file.  Subjective    Lynn Booker is a 23 y.o. female who presents today for a complete physical exam.   She reports consuming a general diet.    ex     She does not have additional problems to discuss today.   Discussed the use of AI scribe software for clinical note transcription with the patient, who gave verbal consent to proceed.  History of Present Illness   Lynn L Ackroyd "Miyah" is a 23 year old female who presents for her annual physical and cervical cancer screening.  She is undergoing her first pelvic exam, which includes a Pap smear for cervical cancer screening. She is aware of the potential for discomfort and spotting during the procedure.  Her mother suggested checking her iron levels due to a possible history of anemia. She is agreeable to a comprehensive blood workup, including tests for anemia, thyroid function, cholesterol, liver and kidney function, diabetes, and screenings for HIV and hepatitis C.  She consents to testing for sexually transmitted infections, including gonorrhea, chlamydia, and trichomoniasis, during the Pap smear.  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 She did not complete physical therapy after a surgery, resulting in weakness, particularly in her legs.         Past Medical History:  Diagnosis Date   ACL injury tear 2023   Lumbar radiculopathy 10/01/2021   No known health problems    Past Surgical History:  Procedure Laterality Date   KNEE ARTHROSCOPY WITH ANTERIOR CRUCIATE LIGAMENT (ACL) REPAIR WITH HAMSTRING GRAFT Right 04/30/2022   Procedure: Right arthroscopic ACL reconstruction using quadriceps tendon autograft;  Surgeon: Signa Kell, MD;  Location: [facility] ORS;  Service:  Orthopedics;  Laterality: Right;   Social History   Socioeconomic History   Marital status: Single    Spouse name: Not on file   Number of children: Not on file   Years of education: Not on file   Highest education level: Not on file  Occupational History   Not on file  Tobacco Use   Smoking status: Never   Smokeless tobacco: Never  Vaping Use   Vaping status: Never Used  Substance and Sexual Activity   Alcohol use: No   Drug use: No   Sexual activity: Not Currently    Birth control/protection: Pill  Other Topics Concern   Not on file  Social History Narrative   ** Merged History Encounter **       Lives in [employer] dorm during school year   Social Drivers of Health   Financial Resource Strain: Low Risk  (02/02/2023)   Received from [facility]   Overall Financial Resource Strain (CARDIA)    Difficulty of Paying Living Expenses: Not hard at all  Food Insecurity: No Food Insecurity (02/02/2023)   Received from [facility]   Hunger Vital Sign    Worried About Running Out of Food in the Last Year: Never true    Ran Out of Food in the Last Year: Never true  Transportation Needs: No Transportation Needs (02/02/2023)   Received from [facility]    In the past 12 months, has lack of transportation kept you  from medical appointments or from getting medications?: No    Lack of Transportation (Non-Medical): No  Physical Activity: Not on file  Stress: Not on file  Social Connections: Not on file  Intimate Partner Violence: Not on file   Family Status  Relation Name Status   Mother  Alive   Father  Alive   Mat Uncle  (Not Specified)   Emelda Brothers  (Not Specified)   Pat Uncle  Alive   MGM  (Not Specified)   PGF  (Not Specified)  No partnership data on file   Family History  Problem Relation Age of Onset   Thyroid disease Mother    Multiple sclerosis Mother    Hypertension Father    Diabetes  Father    Thyroid disease Maternal Uncle    Hypertension Maternal Uncle    Diabetes Paternal Aunt    Diabetes Paternal Uncle    Diabetes Maternal Grandmother    Hypertension Maternal Grandmother    Diabetes Paternal Grandfather    No Known Allergies   Medications: Outpatient Medications Prior to Visit  Medication Sig   ferrous sulfate 325 (65 FE) MG tablet Take 325 mg by mouth daily with breakfast.   norgestimate-ethinyl estradiol (ORTHO-CYCLEN) 0.25-35 MG-MCG tablet Take 1 tablet by mouth daily.   No facility-administered medications prior to visit.    Review of Systems  Last CBC No results found for: "WBC", "HGB", "HCT", "MCV", "MCH", "RDW", "PLT" Last metabolic panel No results found for: "GLUCOSE", "NA", "K", "CL", "CO2", "BUN", "CREATININE", "EGFR", "CALCIUM", "PHOS", "PROT", "ALBUMIN", "LABGLOB", "AGRATIO", "BILITOT", "ALKPHOS", "AST", "ALT", "ANIONGAP" Last lipids No results found for: "CHOL", "HDL", "LDLCALC", "LDLDIRECT", "TRIG", "CHOLHDL" Last hemoglobin A1c No results found for: "HGBA1C" Last thyroid functions Lab Results  Component Value Date   TSH 0.056 (L) 12/21/2022       Objective    BP 111/64 (BP Location: Left Arm, Patient Position: Sitting, Cuff Size: Normal)   Pulse 73   Temp 99.2 F (37.3 C) (Oral)   Resp 16   Wt 162 lb 3.2 oz (73.6 kg)   SpO2 100%   BMI 27.84 kg/m  BP Readings from Last 3 Encounters:  06/10/23 111/64  03/10/23 122/81  12/21/22 122/78   Wt Readings from Last 3 Encounters:  06/10/23 162 lb 3.2 oz (73.6 kg)  03/10/23 163 lb 12.8 oz (74.3 kg)  12/21/22 153 lb (69.4 kg)        Physical Exam Vitals reviewed. Exam conducted with a chaperone present.  Constitutional:      General: She is not in acute distress.    Appearance: Normal appearance. She is not ill-appearing, toxic-appearing or diaphoretic.  HENT:     Head: Normocephalic and atraumatic.     Right Ear: Tympanic membrane and external ear normal. There is no  impacted cerumen.     Left Ear: Tympanic membrane and external ear normal. There is no impacted cerumen.     Nose: Nose normal.     Mouth/Throat:     Pharynx: Oropharynx is clear.  Eyes:     General: No scleral icterus.    Extraocular Movements: Extraocular movements intact.     Conjunctiva/sclera: Conjunctivae normal.     Pupils: Pupils are equal, round, and reactive to light.  Cardiovascular:     Rate and Rhythm: Normal rate and regular rhythm.     Pulses: Normal pulses.     Heart sounds: Normal heart sounds. No murmur heard.    No friction rub. No gallop.  Pulmonary:  Effort: Pulmonary effort is normal. No respiratory distress.     Breath sounds: Normal breath sounds. No wheezing, rhonchi or rales.  Abdominal:     General: Bowel sounds are normal. There is no distension.     Palpations: Abdomen is soft. There is no mass.     Tenderness: There is no abdominal tenderness. There is no guarding.  Genitourinary:    General: Normal vulva.     Pubic Area: No rash or pubic lice.      Labia:        Right: No rash, tenderness, lesion or injury.        Left: No rash, tenderness, lesion or injury.      Urethra: No urethral pain, urethral swelling or urethral lesion.     Vagina: No vaginal discharge, erythema, tenderness, bleeding or lesions.     Cervix: Normal.     Uterus: Normal.      Adnexa: Right adnexa normal and left adnexa normal.     Rectum: Normal.  Musculoskeletal:        General: No deformity.     Cervical back: Normal range of motion and neck supple. No rigidity.     Right lower leg: No edema.     Left lower leg: No edema.  Lymphadenopathy:     Cervical: No cervical adenopathy.  Skin:    General: Skin is warm.     Capillary Refill: Capillary refill takes less than 2 seconds.     Findings: No erythema or rash.  Neurological:     General: No focal deficit present.     Mental Status: She is alert and oriented to person, place, and time.     Motor: No weakness.      Gait: Gait normal.  Psychiatric:        Mood and Affect: Mood normal.        Behavior: Behavior normal.       Last depression screening scores    06/10/2023    1:41 PM 03/10/2023    4:11 PM  PHQ 2/9 Scores  PHQ - 2 Score 1 0  PHQ- 9 Score  4    Last fall risk screening    06/10/2023    1:41 PM  Fall Risk   Falls in the past year? 0  Number falls in past yr: 0  Injury with Fall? 0  Risk for fall due to : No Fall Risks    Last Audit-C alcohol use screening     No data to display         A score of 3 or more in women, and 4 or more in men indicates increased risk for alcohol abuse, EXCEPT if all of the points are from question 1   No results found for any visits on 06/10/23.  Assessment & Plan    Routine Health Maintenance and Physical Exam  Immunization History  Administered Date(s) Administered   HPV Quadrivalent 07/14/2013, 10/06/2013, 07/26/2014   Moderna Covid-19 Vaccine Bivalent Booster 47yrs & up 11/26/2019   Tdap 05/15/2022    Health Maintenance  Topic Date Due   HIV Screening  Never done   Hepatitis C Screening  Never done   COVID-19 Vaccine (2 - 2024-25 season) 12/27/2022   Cervical Cancer Screening (Pap smear)  Never done   INFLUENZA VACCINE  07/26/2023 (Originally 11/26/2022)   DTaP/Tdap/Td (2 - Td or Tdap) 05/15/2032   HPV VACCINES  Completed    Problem List Items Addressed This Visit  Other   IDA (iron deficiency anemia)   Relevant Orders   CBC   Fe+TIBC+Fer   Annual physical exam - Primary   Other Visit Diagnoses       Screening for cervical cancer       Relevant Orders   Cytology - PAP     Screening for diabetes mellitus       Relevant Orders   Hemoglobin A1c     Screening for lipid disorders       Relevant Orders   Lipid panel     Screening for HIV (human immunodeficiency virus)       Relevant Orders   HIV Antibody (routine testing w rflx)     Encounter for hepatitis C screening test for low risk patient        Relevant Orders   Hepatitis C antibody     Overweight (BMI 25.0-29.9)       Relevant Orders   CMP14+EGFR           Annual Physical Examination 23 year old female presenting for her annual physical examination. No acute complaints. Comprehensive physical examination was normal. Discussed the importance of routine blood work, including CBC, CMP, A1c, lipid panel, HIV, and hepatitis C screening. Patient requested iron levels check due to family history of anemia. Results will be available in 2-3 days. - Order CBC, CMP, A1c, lipid panel, HIV, hepatitis C screening, and iron panel - Order thyroid function tests - Perform Pap smear for cervical cancer screening  Cervical Cancer Screening Patient is due for routine Pap smear. Discussed procedure, potential for spotting, and discomfort. Results will be available in 2-3 days. Recommended Pap smears every three years until age 32 if normal, then every five years. - Perform Pap smear - Inform patient about potential spotting and result timeline  Sexually Transmitted Infection (STI) Screening Discussed testing for gonorrhea, chlamydia, and trichomoniasis during Pap smear. Patient declined additional STI testing. - No additional STI testing at this time  General Health Maintenance Discussed the importance of routine health maintenance and screenings. - Continue routine health maintenance and screenings as per guidelines  Follow-up - Review lab results in 2-3 days - Follow-up as needed based on lab results.           No follow-ups on file.       Ronnald Ramp, MD  [facility] [phone] (phone) [phone] (fax)  [facility]

## 2023-06-11 ENCOUNTER — Encounter: Payer: Self-pay | Admitting: Family Medicine

## 2023-06-11 LAB — CMP14+EGFR
ALT: 12 [IU]/L (ref 0–32)
AST: 15 [IU]/L (ref 0–40)
Albumin: 4.3 g/dL (ref 4.0–5.0)
Alkaline Phosphatase: 72 [IU]/L (ref 44–121)
BUN/Creatinine Ratio: 15 (ref 9–23)
BUN: 11 mg/dL (ref 6–20)
Bilirubin Total: 0.3 mg/dL (ref 0.0–1.2)
CO2: 19 mmol/L — ABNORMAL LOW (ref 20–29)
Calcium: 9.9 mg/dL (ref 8.7–10.2)
Chloride: 100 mmol/L (ref 96–106)
Creatinine, Ser: 0.73 mg/dL (ref 0.57–1.00)
Globulin, Total: 2.9 g/dL (ref 1.5–4.5)
Glucose: 75 mg/dL (ref 70–99)
Potassium: 4.2 mmol/L (ref 3.5–5.2)
Sodium: 138 mmol/L (ref 134–144)
Total Protein: 7.2 g/dL (ref 6.0–8.5)
eGFR: 120 mL/min/{1.73_m2} (ref >59)

## 2023-06-11 LAB — IRON,TIBC AND FERRITIN PANEL
Ferritin: 54 ng/mL (ref 15–150)
Iron Saturation: 32 % (ref 15–55)
Iron: 127 ug/dL (ref 27–159)
Total Iron Binding Capacity: 400 ug/dL (ref 250–450)
UIBC: 273 ug/dL (ref 131–425)

## 2023-06-11 LAB — HEMOGLOBIN A1C
Est. average glucose Bld gHb Est-mCnc: 114 mg/dL
Hgb A1c MFr Bld: 5.6 % (ref 4.8–5.6)

## 2023-06-11 LAB — CBC
Hematocrit: 39.8 % (ref 34.0–46.6)
Hemoglobin: 12.7 g/dL (ref 11.1–15.9)
MCH: 27.7 pg (ref 26.6–33.0)
MCHC: 31.9 g/dL (ref 31.5–35.7)
MCV: 87 fL (ref 79–97)
Platelets: 296 10*3/uL (ref 150–450)
RBC: 4.59 x10E6/uL (ref 3.77–5.28)
RDW: 13.1 % (ref 11.7–15.4)
WBC: 5.7 10*3/uL (ref 3.4–10.8)

## 2023-06-11 LAB — HIV ANTIBODY (ROUTINE TESTING W REFLEX): HIV Screen 4th Generation wRfx: NONREACTIVE

## 2023-06-11 LAB — LIPID PANEL
Chol/HDL Ratio: 2.6 {ratio} (ref 0.0–4.4)
Cholesterol, Total: 191 mg/dL (ref 100–199)
HDL: 73 mg/dL (ref >39)
LDL Chol Calc (NIH): 100 mg/dL — ABNORMAL HIGH (ref 0–99)
Triglycerides: 102 mg/dL (ref 0–149)
VLDL Cholesterol Cal: 18 mg/dL (ref 5–40)

## 2023-06-11 LAB — HEPATITIS C ANTIBODY: Hep C Virus Ab: NONREACTIVE

## 2023-06-15 DIAGNOSIS — R109 Unspecified abdominal pain: Secondary | ICD-10-CM | POA: Diagnosis not present

## 2023-06-15 DIAGNOSIS — R0789 Other chest pain: Secondary | ICD-10-CM | POA: Diagnosis not present

## 2023-06-15 DIAGNOSIS — R079 Chest pain, unspecified: Secondary | ICD-10-CM | POA: Diagnosis not present

## 2023-06-15 DIAGNOSIS — R002 Palpitations: Secondary | ICD-10-CM | POA: Diagnosis not present

## 2023-06-16 DIAGNOSIS — R079 Chest pain, unspecified: Secondary | ICD-10-CM | POA: Diagnosis not present

## 2023-06-16 LAB — CYTOLOGY - PAP
Chlamydia: NEGATIVE
Comment: NEGATIVE
Comment: NEGATIVE
Comment: NORMAL
Diagnosis: NEGATIVE
Neisseria Gonorrhea: NEGATIVE
Trichomonas: NEGATIVE

## 2023-06-16 MED ORDER — METRONIDAZOLE 500 MG PO TABS: 500.0000 mg | ORAL_TABLET | Freq: Three times a day (TID) | ORAL | 0 refills | Status: AC

## 2023-06-16 NOTE — Addendum Note (Signed)
Addended by: Bing Neighbors on: 06/16/2023 04:33 PM   Modules accepted: Orders

## 2023-06-17 ENCOUNTER — Encounter: Payer: Self-pay | Admitting: Family Medicine

## 2023-06-17 ENCOUNTER — Telehealth: Payer: BC Managed Care – PPO | Admitting: Family Medicine

## 2023-06-17 DIAGNOSIS — B09 Unspecified viral infection characterized by skin and mucous membrane lesions: Secondary | ICD-10-CM | POA: Diagnosis not present

## 2023-06-17 DIAGNOSIS — U071 COVID-19: Secondary | ICD-10-CM | POA: Diagnosis not present

## 2023-06-17 DIAGNOSIS — R079 Chest pain, unspecified: Secondary | ICD-10-CM

## 2023-06-17 MED ORDER — PREDNISONE 20 MG PO TABS: 40.0000 mg | ORAL_TABLET | Freq: Every day | ORAL | 0 refills | Status: AC

## 2023-06-17 MED ORDER — CETIRIZINE HCL 10 MG PO TABS: 10.0000 mg | ORAL_TABLET | Freq: Every day | ORAL | 11 refills | Status: DC

## 2023-06-17 NOTE — Patient Instructions (Signed)
VISIT SUMMARY:  Today, we discussed your recent COVID-19 infection, which has caused a widespread rash and chest discomfort. We reviewed your symptoms and medical history, and created a plan to address your current health issues.  YOUR PLAN:  -COVID-19 INFECTION: You have a confirmed COVID-19 infection with symptoms such as headache, muscle pain, chills, fever, chest discomfort, and hand pain. We will start you on prednisone to help reduce inflammation and relieve symptoms. If the rash persists after a few days, you can take cetirizine. Make sure to stay hydrated and get plenty of rest. We will also refer you to a cardiologist to evaluate your chest discomfort and palpitations.  -CHEST DISCOMFORT: Your chest discomfort could be related to your COVID-19 infection or other heart-related issues. We will refer you to a cardiologist to rule out any serious conditions. Please monitor for any worsening symptoms like difficulty breathing, lightheadedness, or severe chest pain.  -WIDESPREAD RASH: The rash you have is likely due to a viral infection or an allergic reaction. We will treat it with prednisone to reduce inflammation and itching. If the rash does not improve after a few days, you can take cetirizine. Benadryl at night can also help with itching.  -IRON DEFICIENCY ANEMIA: You have a history of iron deficiency anemia, but we did not discuss any specific symptoms or treatment for this during today's visit.  INSTRUCTIONS:  We will submit a referral to cardiology for further evaluation of your chest discomfort. Your prescriptions will be sent to [facility]. Please follow up with cardiology as scheduled.

## 2023-06-17 NOTE — Progress Notes (Signed)
MyChart Video Visit    Virtual Visit via Video Note   This format is felt to be most appropriate for this patient at this time. Physical exam was limited by quality of the video and audio technology used for the visit.   Patient location: Patient's home address   Provider location: [facility]  [address]  [place], [place] [address]   I discussed the limitations of evaluation and management by telemedicine and the availability of in person appointments. The patient expressed understanding and agreed to proceed.  Patient: Lynn Booker   DOB: 02-27-2001   23 y.o. Female  MRN: [id number] Visit Date: 06/17/2023  Today's healthcare provider: Ronnald Ramp, MD   No chief complaint on file.  Subjective    HPI   Discussed the use of AI scribe software for clinical note transcription with the patient, who gave verbal consent to proceed.  History of Present Illness   Lynn Booker "Miyah" is a 23 year old female who presents with a widespread rash and chest discomfort. She is accompanied by her mother.  She tested positive for COVID-19 on Wednesday after experiencing symptoms since Monday, including fluctuating temperature sensations, feeling cold and hot, persistent hand pain, and intermittent chest discomfort.  She developed a widespread rash characterized by red, itchy papules on her face, arms, back, legs, neck, and stomach. She has not started any new medications recently and has not been prescribed any treatment for the rash at the emergency department. She has been taking Allegra for itching, but it has not been effective.  Chest discomfort is described as a pressure-like sensation in the middle and left side of the chest, occasionally sharp but resolving quickly. There is no radiation of the pain to her shoulder blade, abdomen, or neck. She also reports neck pain, which she describes as feeling like a strain.  Her past medical  history includes iron deficiency anemia, a history of palpitations since fifth grade, and fluctuating thyroid levels. Her mother mentions that her thyroid levels have been slightly elevated in the past.  No shortness of breath, lightheadedness, or dizziness.          Past Medical History:  Diagnosis Date   ACL injury tear 2023   Lumbar radiculopathy 10/01/2021   No known health problems     Medications: Outpatient Medications Prior to Visit  Medication Sig   ferrous sulfate 325 (65 FE) MG tablet Take 325 mg by mouth daily with breakfast.   metroNIDAZOLE (FLAGYL) 500 MG tablet Take 1 tablet (500 mg total) by mouth 3 (three) times daily for 7 days.   norgestimate-ethinyl estradiol (ORTHO-CYCLEN) 0.25-35 MG-MCG tablet Take 1 tablet by mouth daily.   No facility-administered medications prior to visit.    Review of Systems  Last CBC Lab Results  Component Value Date   WBC 5.7 06/10/2023   HGB 12.7 06/10/2023   HCT 39.8 06/10/2023   MCV 87 06/10/2023   MCH 27.7 06/10/2023   RDW 13.1 06/10/2023   PLT 296 06/10/2023   Last metabolic panel Lab Results  Component Value Date   GLUCOSE 75 06/10/2023   NA 138 06/10/2023   K 4.2 06/10/2023   CL 100 06/10/2023   CO2 19 (L) 06/10/2023   BUN 11 06/10/2023   CREATININE 0.73 06/10/2023   EGFR 120 06/10/2023   CALCIUM 9.9 06/10/2023   PROT 7.2 06/10/2023   ALBUMIN 4.3 06/10/2023   LABGLOB 2.9 06/10/2023   BILITOT 0.3 06/10/2023  ALKPHOS 72 06/10/2023   AST 15 06/10/2023   ALT 12 06/10/2023   Last lipids Lab Results  Component Value Date   CHOL 191 06/10/2023   HDL 73 06/10/2023   LDLCALC 100 (H) 06/10/2023   TRIG 102 06/10/2023   CHOLHDL 2.6 06/10/2023   Last hemoglobin A1c Lab Results  Component Value Date   HGBA1C 5.6 06/10/2023   Last thyroid functions Lab Results  Component Value Date   TSH 0.056 (L) 12/21/2022        Objective    There were no vitals taken for this visit.  BP Readings from Last  3 Encounters:  06/10/23 111/64  03/10/23 122/81  12/21/22 122/78   Wt Readings from Last 3 Encounters:  06/10/23 162 lb 3.2 oz (73.6 kg)  03/10/23 163 lb 12.8 oz (74.3 kg)  12/21/22 153 lb (69.4 kg)        Physical Exam Constitutional:      General: She is not in acute distress.    Appearance: Normal appearance. She is ill-appearing.  Pulmonary:     Effort: Pulmonary effort is normal. No respiratory distress.  Skin:    Findings: Rash present. Rash is papular.     Comments: Papular rash on patient's face and extremities   Neurological:     Mental Status: She is alert and oriented to person, place, and time.        Assessment & Plan     Problem List Items Addressed This Visit   None Visit Diagnoses       COVID-19    -  Primary     Viral exanthem       Relevant Medications   predniSONE (DELTASONE) 20 MG tablet   cetirizine (ZYRTEC) 10 MG tablet     Chest pain at rest       Relevant Orders   Ambulatory referral to Cardiology           COVID-19 Infection Confirmed COVID-19 infection with symptoms including headache, myalgia, intermittent chills and fever, chest discomfort, and hand pain. Symptoms have persisted since Monday without improvement. Discussed prednisone for inflammation and symptom relief, with potential side effects including hyperglycemia and mood changes. Consider cetirizine if rash persists post-prednisone. - Prescribe prednisone 40 mg once daily for 5 days - Monitor symptoms; consider cetirizine 10 mg once daily if rash persists after 3-4 days of prednisone - Advise hydration and rest - Refer to cardiology for evaluation of chest discomfort and palpitations  Chest Discomfort Intermittent chest pressure and occasional sharp pain. Differential includes COVID-19 related myocarditis versus other cardiac etiologies. Previous palpitations and slightly elevated thyroid levels noted. Cardiology referral needed to rule out serious cardiac conditions.  Monitor for worsening symptoms such as dyspnea, lightheadedness, or severe chest pain. - Refer to cardiology for further evaluation - Monitor for worsening symptoms such as dyspnea, lightheadedness, or severe chest pain  Widespread Rash New onset of widespread pruritic erythematous papules, likely viral exanthem versus allergic reaction to metronidazole recently prescribed for BV on 06/16/23. Discussed systemic prednisone for inflammation and pruritus, with potential side effects. Consider over-the-counter cetirizine if prescription is cost-prohibitive. Benadryl recommended at night for itching. - Prescribe prednisone 40 mg once daily for 5 days - Consider cetirizine 10 mg once daily if rash persists after 3-4 days of prednisone - Recommend over-the-counter cetirizine if prescription is cost-prohibitive - Advise Benadryl at night if needed for itching       No follow-ups on file.     I discussed the  assessment and treatment plan with the patient. The patient was provided an opportunity to ask questions and all were answered. The patient agreed with the plan and demonstrated an understanding of the instructions.   The patient was advised to call back or seek an in-person evaluation if the symptoms worsen or if the condition fails to improve as anticipated.  I provided 16 minutes of non-face-to-face time during this encounter.   Ronnald Ramp, MD [facility] [phone] (phone) [phone] (fax)  [facility]

## 2023-08-11 ENCOUNTER — Ambulatory Visit: Payer: BC Managed Care – PPO | Attending: Cardiology | Admitting: Cardiology

## 2023-08-11 ENCOUNTER — Encounter: Payer: Self-pay | Admitting: Cardiology

## 2023-08-11 ENCOUNTER — Ambulatory Visit

## 2023-08-11 VITALS — BP 116/72 | HR 74 | Ht 65.0 in | Wt 162.0 lb

## 2023-08-11 DIAGNOSIS — R002 Palpitations: Secondary | ICD-10-CM

## 2023-08-11 DIAGNOSIS — R079 Chest pain, unspecified: Secondary | ICD-10-CM | POA: Diagnosis not present

## 2023-08-11 NOTE — Progress Notes (Signed)
 Cardiology Office Note:    Date:  08/11/2023   ID:  Elvis Hamming, DOB [birth year], MRN [id number]  PCP:  Mimi Alt, MD   [facility] Providers Cardiologist:  None     Referring MD: Renea Carrion*   No chief complaint on file.  Lynn Booker is a 23 y.o. female who is being seen today for the evaluation of chest pain at the request of Simmons-Robinson, Makie*.   History of Present Illness:    Lynn Booker is a 23 y.o. female with no significant past medical history who presents with chest pain and palpitations.  States symptoms of palpitations occur randomly, not associated with exertion.  Palpitations have been ongoing several years now, occurring very infrequently.  Last occurrence maybe a month ago.  Denies any dizziness, presyncope or syncope.  Also complains of left-sided nonexertional chest discomfort occurring very randomly.  She works out frequently at [employer] without any issues.   Past Medical History:  Diagnosis Date   ACL injury tear 2023   Lumbar radiculopathy 10/01/2021   No known health problems     Past Surgical History:  Procedure Laterality Date   KNEE ARTHROSCOPY WITH ANTERIOR CRUCIATE LIGAMENT (ACL) REPAIR WITH HAMSTRING GRAFT Right 04/30/2022   Procedure: Right arthroscopic ACL reconstruction using quadriceps tendon autograft;  Surgeon: Lorri Rota, MD;  Location: [facility] ORS;  Service: Orthopedics;  Laterality: Right;    Current Medications: Current Meds  Medication Sig   cetirizine (ZYRTEC) 10 MG tablet Take 1 tablet (10 mg total) by mouth daily.     Allergies:   Patient has no known allergies.   Social History   Socioeconomic History   Marital status: Single    Spouse name: Not on file   Number of children: Not on file   Years of education: Not on file   Highest education level: Not on file  Occupational History   Not on file  Tobacco Use   Smoking status: Never   Smokeless tobacco: Never  Vaping Use    Vaping status: Never Used  Substance and Sexual Activity   Alcohol use: No   Drug use: No   Sexual activity: Not Currently    Birth control/protection: Pill  Other Topics Concern   Not on file  Social History Narrative   ** Merged History Encounter **       Lives in [place] dorm during school year   Social Drivers of Health   Financial Resource Strain: Low Risk  (02/02/2023)   Received from [facility]   Overall Financial Resource Strain (CARDIA)    Difficulty of Paying Living Expenses: Not hard at all  Food Insecurity: No Food Insecurity (02/02/2023)   Received from [facility]   Hunger Vital Sign    Worried About Running Out of Food in the Last Year: Never true    Ran Out of Food in the Last Year: Never true  Transportation Needs: No Transportation Needs (02/02/2023)   Received from [facility]    In the past 12 months, has lack of transportation kept you from medical appointments or from getting medications?: No    Lack of Transportation (Non-Medical): No  Physical Activity: Not on file  Stress: Not on file  Social Connections: Not on file     Family History: The patient's family history includes Diabetes in her father, maternal grandmother, paternal aunt, paternal grandfather, and paternal uncle; Hypertension in her father,  maternal grandmother, and maternal uncle; Multiple sclerosis in her mother; Thyroid disease in her maternal uncle and mother.  ROS:   Please see the history of present illness.     All other systems reviewed and are negative.  EKGs/Labs/Other Studies Reviewed:    The following studies were reviewed today:  EKG Interpretation Date/Time:  Wednesday August 11 2023 10:08:17 EDT Ventricular Rate:  67 PR Interval:  148 QRS Duration:  80 QT Interval:  396 QTC Calculation: 418 R Axis:   67  Text Interpretation: Sinus rhythm with marked sinus arrhythmia Confirmed by  Constancia Delton ([id number]) on 08/11/2023 10:13:43 AM    Recent Labs: 12/21/2022: TSH 0.056 06/10/2023: ALT 12; BUN 11; Creatinine, Ser 0.73; Hemoglobin 12.7; Platelets 296; Potassium 4.2; Sodium 138  Recent Lipid Panel    Component Value Date/Time   CHOL 191 06/10/2023 1412   TRIG 102 06/10/2023 1412   HDL 73 06/10/2023 1412   CHOLHDL 2.6 06/10/2023 1412   LDLCALC 100 (H) 06/10/2023 1412     Risk Assessment/Calculations:             Physical Exam:    VS:  BP 116/72 (BP Location: Left Arm, Patient Position: Sitting)   Pulse 74   Ht 5\' 5"  (1.651 m)   Wt 162 lb (73.5 kg)   SpO2 99%   BMI 26.96 kg/m     Wt Readings from Last 3 Encounters:  08/11/23 162 lb (73.5 kg)  06/10/23 162 lb 3.2 oz (73.6 kg)  03/10/23 163 lb 12.8 oz (74.3 kg)     GEN:  Well nourished, well developed in no acute distress HEENT: Normal NECK: No JVD; No carotid bruits CARDIAC: RRR, no murmurs, rubs, gallops RESPIRATORY:  Clear to auscultation without rales, wheezing or rhonchi  ABDOMEN: Soft, non-tender, non-distended MUSCULOSKELETAL:  No edema; No deformity  SKIN: Warm and dry NEUROLOGIC:  Alert and oriented x 3 PSYCHIATRIC:  Normal affect   ASSESSMENT:    1. Palpitations   2. Chest pain of uncertain etiology    PLAN:    In order of problems listed above:  Palpitations, placed 2-week cardiac monitor to evaluate any significant arrhythmias.  Symptoms appear infrequent/benign.  No dizziness or syncope. Chest pain, very atypical.  Obtain echo.  Symptoms not consistent with angina.  Reassure patient if no significant structural abnormalities revealed.  Follow-up after cardiac testing     Medication Adjustments/Labs and Tests Ordered: Current medicines are reviewed at length with the patient today.  Concerns regarding medicines are outlined above.  Orders Placed This Encounter  Procedures   LONG TERM MONITOR-LIVE TELEMETRY (3-14 DAYS)   EKG 12-Lead   ECHOCARDIOGRAM COMPLETE   No orders  of the defined types were placed in this encounter.   Patient Instructions  Medication Instructions:  Your physician recommends that you continue on your current medications as directed. Please refer to the Current Medication list given to you today.  *If you need a refill on your cardiac medications before your next appointment, please call your pharmacy*  Lab Work: NONE ordered at this time of appointment   Testing/Procedures: Your physician has requested that you have an echocardiogram. Echocardiography is a painless test that uses sound waves to create images of your heart. It provides your doctor with information about the size and shape of your heart and how well your heart's chambers and valves are working. This procedure takes approximately one hour. There are no restrictions for this procedure. Please do NOT wear cologne, perfume, aftershave, or  lotions (deodorant is allowed). Please arrive 15 minutes prior to your appointment time.  Please note: We ask at that you not bring children with you during ultrasound (echo/ vascular) testing. Due to room size and safety concerns, children are not allowed in the ultrasound rooms during exams. Our front office staff cannot provide observation of children in our lobby area while testing is being conducted. An adult accompanying a patient to their appointment will only be allowed in the ultrasound room at the discretion of the ultrasound technician under special circumstances. We apologize for any inconvenience.  ZIO XT- Long Term Monitor Instructions  Your physician has requested you wear a ZIO patch monitor for 14 days.  This is a single patch monitor. [employer] supplies one patch monitor per enrollment. Additional stickers are not available. Please do not apply patch if you will be having a Nuclear Stress Test,  Echocardiogram, Cardiac CT, MRI, or Chest Xray during the period you would be wearing the  monitor. The patch cannot be worn during  these tests. You cannot remove and re-apply the  ZIO XT patch monitor.  Your ZIO patch monitor will be mailed 3 day USPS to your address on file. It may take 3-5 days  to receive your monitor after you have been enrolled.  Once you have received your monitor, please review the enclosed instructions. Your monitor  has already been registered assigning a specific monitor serial # to you.  Billing and Patient Assistance Program Information  We have supplied [employer] with any of your insurance information on file for billing purposes. [employer] offers a sliding scale Patient Assistance Program for patients that do not have  insurance, or whose insurance does not completely cover the cost of the ZIO monitor.  You must apply for the Patient Assistance Program to qualify for this discounted rate.  To apply, please call [employer] at [phone], select option 4, select option 2, ask to apply for  Patient Assistance Program. Sanna Crystal will ask your household income, and how many people  are in your household. They will quote your out-of-pocket cost based on that information.  [employer] will also be able to set up a 35-month, interest-free payment plan if needed.  Applying the monitor   Shave hair from upper left chest.  Hold abrader disc by orange tab. Rub abrader in 40 strokes over the upper left chest as  indicated in your monitor instructions.  Clean area with 4 enclosed alcohol pads. Let dry.  Apply patch as indicated in monitor instructions. Patch will be placed under collarbone on left  side of chest with arrow pointing upward.  Rub patch adhesive wings for 2 minutes. Remove white label marked "1". Remove the white  label marked "2". Rub patch adhesive wings for 2 additional minutes.  While looking in a mirror, press and release button in center of patch. A small green light will  flash 3-4 times. This will be your only indicator that the monitor has been turned on.  Do not shower for the first 24  hours. You may shower after the first 24 hours.  Press the button if you feel a symptom. You will hear a small click. Record Date, Time and  Symptom in the Patient Logbook.  When you are ready to remove the patch, follow instructions on the last 2 pages of Patient  Logbook. Stick patch monitor onto the last page of Patient Logbook.  Place Patient Logbook in the blue and white box. Use locking tab on box and  tape box closed  securely. The blue and white box has prepaid postage on it. Please place it in the mailbox as  soon as possible. Your physician should have your test results approximately 7 days after the  monitor has been mailed back to [facility].  Call [facility] Customer Care at [phone] if you have questions regarding  your ZIO XT patch monitor. Call them immediately if you see an orange light blinking on your  monitor.  If your monitor falls off in less than 4 days, contact our Monitor department at [phone].  If your monitor becomes loose or falls off after 4 days call [employer] at [phone] for  suggestions on securing your monitor   Follow-Up: At [facility], you and your health needs are our priority.  As part of our continuing mission to provide you with exceptional heart care, our providers are all part of one team.  This team includes your primary Cardiologist (physician) and Advanced Practice Providers or APPs (Physician Assistants and Nurse Practitioners) who all work together to provide you with the care you need, when you need it.  Your next appointment:   3 month(s)  Provider:   Any APP    We recommend signing up for the patient portal called "MyChart".  Sign up information is provided on this After Visit Summary.  MyChart is used to connect with patients for Virtual Visits (Telemedicine).  Patients are able to view lab/test results, encounter notes, upcoming appointments, etc.  Non-urgent messages can be sent to your provider as well.    To learn more about what you can do with MyChart, go to [email or web address].   Other Instructions          Signed, Constancia Delton, MD  08/11/2023 10:53 AM    [facility]

## 2023-08-11 NOTE — Patient Instructions (Signed)
 Medication Instructions:  Your physician recommends that you continue on your current medications as directed. Please refer to the Current Medication list given to you today.  *If you need a refill on your cardiac medications before your next appointment, please call your pharmacy*  Lab Work: NONE ordered at this time of appointment   Testing/Procedures: Your physician has requested that you have an echocardiogram. Echocardiography is a painless test that uses sound waves to create images of your heart. It provides your doctor with information about the size and shape of your heart and how well your heart's chambers and valves are working. This procedure takes approximately one hour. There are no restrictions for this procedure. Please do NOT wear cologne, perfume, aftershave, or lotions (deodorant is allowed). Please arrive 15 minutes prior to your appointment time.  Please note: We ask at that you not bring children with you during ultrasound (echo/ vascular) testing. Due to room size and safety concerns, children are not allowed in the ultrasound rooms during exams. Our front office staff cannot provide observation of children in our lobby area while testing is being conducted. An adult accompanying a patient to their appointment will only be allowed in the ultrasound room at the discretion of the ultrasound technician under special circumstances. We apologize for any inconvenience.  ZIO XT- Long Term Monitor Instructions  Your physician has requested you wear a ZIO patch monitor for 14 days.  This is a single patch monitor. [employer] supplies one patch monitor per enrollment. Additional stickers are not available. Please do not apply patch if you will be having a Nuclear Stress Test,  Echocardiogram, Cardiac CT, MRI, or Chest Xray during the period you would be wearing the  monitor. The patch cannot be worn during these tests. You cannot remove and re-apply the  ZIO XT patch monitor.  Your  ZIO patch monitor will be mailed 3 day USPS to your address on file. It may take 3-5 days  to receive your monitor after you have been enrolled.  Once you have received your monitor, please review the enclosed instructions. Your monitor  has already been registered assigning a specific monitor serial # to you.  Billing and Patient Assistance Program Information  We have supplied [employer] with any of your insurance information on file for billing purposes. [employer] offers a sliding scale Patient Assistance Program for patients that do not have  insurance, or whose insurance does not completely cover the cost of the ZIO monitor.  You must apply for the Patient Assistance Program to qualify for this discounted rate.  To apply, please call [employer] at [phone], select option 4, select option 2, ask to apply for  Patient Assistance Program. Sanna Crystal will ask your household income, and how many people  are in your household. They will quote your out-of-pocket cost based on that information.  [employer] will also be able to set up a 43-month, interest-free payment plan if needed.  Applying the monitor   Shave hair from upper left chest.  Hold abrader disc by orange tab. Rub abrader in 40 strokes over the upper left chest as  indicated in your monitor instructions.  Clean area with 4 enclosed alcohol pads. Let dry.  Apply patch as indicated in monitor instructions. Patch will be placed under collarbone on left  side of chest with arrow pointing upward.  Rub patch adhesive wings for 2 minutes. Remove white label marked "1". Remove the white  label marked "2". Rub patch adhesive wings for 2 additional  minutes.  While looking in a mirror, press and release button in center of patch. A small green light will  flash 3-4 times. This will be your only indicator that the monitor has been turned on.  Do not shower for the first 24 hours. You may shower after the first 24 hours.  Press the button if you feel  a symptom. You will hear a small click. Record Date, Time and  Symptom in the Patient Logbook.  When you are ready to remove the patch, follow instructions on the last 2 pages of Patient  Logbook. Stick patch monitor onto the last page of Patient Logbook.  Place Patient Logbook in the blue and white box. Use locking tab on box and tape box closed  securely. The blue and white box has prepaid postage on it. Please place it in the mailbox as  soon as possible. Your physician should have your test results approximately 7 days after the  monitor has been mailed back to [facility].  Call [facility] Customer Care at [phone] if you have questions regarding  your ZIO XT patch monitor. Call them immediately if you see an orange light blinking on your  monitor.  If your monitor falls off in less than 4 days, contact our Monitor department at [phone].  If your monitor becomes loose or falls off after 4 days call [employer] at [phone] for  suggestions on securing your monitor   Follow-Up: At [facility], you and your health needs are our priority.  As part of our continuing mission to provide you with exceptional heart care, our providers are all part of one team.  This team includes your primary Cardiologist (physician) and Advanced Practice Providers or APPs (Physician Assistants and Nurse Practitioners) who all work together to provide you with the care you need, when you need it.  Your next appointment:   3 month(s)  Provider:   Any APP    We recommend signing up for the patient portal called "MyChart".  Sign up information is provided on this After Visit Summary.  MyChart is used to connect with patients for Virtual Visits (Telemedicine).  Patients are able to view lab/test results, encounter notes, upcoming appointments, etc.  Non-urgent messages can be sent to your provider as well.   To learn more about what you can do with MyChart, go to [email or web address].    Other Instructions

## 2023-09-03 ENCOUNTER — Other Ambulatory Visit: Payer: Self-pay | Admitting: Cardiology

## 2023-09-03 DIAGNOSIS — R002 Palpitations: Secondary | ICD-10-CM

## 2023-09-03 DIAGNOSIS — R079 Chest pain, unspecified: Secondary | ICD-10-CM

## 2023-09-08 ENCOUNTER — Ambulatory Visit: Attending: Cardiology

## 2023-09-08 DIAGNOSIS — R079 Chest pain, unspecified: Secondary | ICD-10-CM | POA: Diagnosis not present

## 2023-09-08 LAB — ECHOCARDIOGRAM COMPLETE
AR max vel: 2.79 cm2
AV Area VTI: 2.83 cm2
AV Area mean vel: 2.83 cm2
AV Mean grad: 2 mmHg
AV Peak grad: 4.2 mmHg
Ao pk vel: 1.02 m/s
Area-P 1/2: 3.79 cm2
Calc EF: 56.5 %
S' Lateral: 3.04 cm
Single Plane A2C EF: 59 %
Single Plane A4C EF: 53.3 %

## 2023-09-09 ENCOUNTER — Ambulatory Visit: Payer: Self-pay | Admitting: Cardiology

## 2023-09-13 DIAGNOSIS — S83511D Sprain of anterior cruciate ligament of right knee, subsequent encounter: Secondary | ICD-10-CM | POA: Diagnosis not present

## 2023-09-23 ENCOUNTER — Ambulatory Visit: Payer: Self-pay | Admitting: Cardiology

## 2023-09-23 DIAGNOSIS — R002 Palpitations: Secondary | ICD-10-CM | POA: Diagnosis not present

## 2023-09-24 DIAGNOSIS — S83511D Sprain of anterior cruciate ligament of right knee, subsequent encounter: Secondary | ICD-10-CM | POA: Diagnosis not present

## 2023-09-27 ENCOUNTER — Other Ambulatory Visit: Payer: Self-pay | Admitting: Orthopedic Surgery

## 2023-09-27 DIAGNOSIS — S83511D Sprain of anterior cruciate ligament of right knee, subsequent encounter: Secondary | ICD-10-CM

## 2023-09-29 ENCOUNTER — Ambulatory Visit
Admission: RE | Admit: 2023-09-29 | Discharge: 2023-09-29 | Disposition: A | Discharge status: Home / Self Care | Source: Ambulatory Visit | Attending: Orthopedic Surgery | Admitting: Orthopedic Surgery

## 2023-09-29 DIAGNOSIS — S83421A Sprain of lateral collateral ligament of right knee, initial encounter: Secondary | ICD-10-CM | POA: Diagnosis not present

## 2023-09-29 DIAGNOSIS — M948X6 Other specified disorders of cartilage, lower leg: Secondary | ICD-10-CM | POA: Diagnosis not present

## 2023-09-29 DIAGNOSIS — S83411A Sprain of medial collateral ligament of right knee, initial encounter: Secondary | ICD-10-CM | POA: Diagnosis not present

## 2023-09-29 DIAGNOSIS — S83511D Sprain of anterior cruciate ligament of right knee, subsequent encounter: Secondary | ICD-10-CM | POA: Insufficient documentation

## 2023-09-29 DIAGNOSIS — R609 Edema, unspecified: Secondary | ICD-10-CM | POA: Diagnosis not present

## 2023-10-25 DIAGNOSIS — S83511D Sprain of anterior cruciate ligament of right knee, subsequent encounter: Secondary | ICD-10-CM | POA: Diagnosis not present

## 2023-11-11 ENCOUNTER — Ambulatory Visit: Attending: Cardiology | Admitting: Cardiology

## 2023-11-11 ENCOUNTER — Encounter: Payer: Self-pay | Admitting: Cardiology

## 2023-11-11 VITALS — BP 110/70 | HR 64 | Ht 65.0 in | Wt 154.2 lb

## 2023-11-11 DIAGNOSIS — R079 Chest pain, unspecified: Secondary | ICD-10-CM | POA: Diagnosis not present

## 2023-11-11 DIAGNOSIS — R002 Palpitations: Secondary | ICD-10-CM

## 2023-11-11 DIAGNOSIS — D508 Other iron deficiency anemias: Secondary | ICD-10-CM | POA: Diagnosis not present

## 2023-11-11 NOTE — Patient Instructions (Signed)
 Medication Instructions:  Your physician recommends that you continue on your current medications as directed. Please refer to the Current Medication list given to you today.   *If you need a refill on your cardiac medications before your next appointment, please call your pharmacy*  Lab Work: No labs ordered today  If you have labs (blood work) drawn today and your tests are completely normal, you will receive your results only by: MyChart Message (if you have MyChart) OR A paper copy in the mail If you have any lab test that is abnormal or we need to change your treatment, we will call you to review the results.  Testing/Procedures: No test ordered today   Follow-Up: At [facility], you and your health needs are our priority.  As part of our continuing mission to provide you with exceptional heart care, our providers are all part of one team.  This team includes your primary Cardiologist (physician) and Advanced Practice Providers or APPs (Physician Assistants and Nurse Practitioners) who all work together to provide you with the care you need, when you need it.  Your next appointment:   6 month(s)  Provider:   Redell Cave, MD or Tylene Lunch, NP

## 2023-11-11 NOTE — Progress Notes (Signed)
 Cardiology Office Note   Date:  11/11/2023  ID:  Lynn Booker, DOB [birth year], MRN [id number] PCP: Sharma Coyer, MD  [facility] Providers Cardiologist:  Redell Cave, MD Cardiology APP:  Gerard Frederick, NP     History of Present Illness Lynn Booker is a 23 y.o. female with no significant past medical history who had recent complaints of atypical chest pain and palpitations, who is here today for follow-up.  She was previously seen in clinic 08/11/2023 by Dr Cave.  She was sent on a referral from her primary care provider stating that she had palpitations that occur randomly and not associated with exertion.  Palpitations have been ongoing several years now occurring very infrequently.  Her last occurrence was possibly a month ago.  She denied any associated symptoms of dizziness, presyncope or syncope.  She also had complaints of left-sided nonexertional chest discomfort occurring randomly.  She works out frequently at the [employer] without any issues.  Labs had been stable.  She had an echocardiogram that was ordered and a ZIO XT monitor to rule out arrhythmias.  She returns to clinic today stating that she has been doing well from the cardiac perspective.  She denies any recurrence of palpitations with only 1 episode.  Chest pain has resolved.  Her mother is on speaker phone today during her visit.  States that she is back in the gym she does have some swelling and discomfort to her knee but has been followed by Ortho for that.  She denies any hospitalizations or visits to the emergency department.  ROS: 10 point review of systems has been reviewed and considered negative with exception was been listed in the HPI  Studies Reviewed     Event Monitor (Zio) 09/23/2023 Patient had a min HR of 44 bpm, max HR of 195 bpm, and avg HR of 85 bpm. Predominant underlying rhythm was Sinus Rhythm. Isolated SVEs were rare (<1.0%), SVE Couplets were rare (<1.0%), and no SVE  Triplets were present. Isolated VEs were rare (<1.0%), VE  Couplets were rare (<1.0%), and no VE Triplets were present. Ventricular Trigeminy was present.    Conclusion Average heart rate 85 bpm, range 44-195. No atrial fibrillation or atrial flutter. No sustained arrhythmias.  2D echo 09/08/2023  1. Left ventricular ejection fraction, by estimation, is 55 to 60%. Left  ventricular ejection fraction by PLAX is 55 %. The left ventricle has  normal function. The left ventricle has no regional wall motion  abnormalities. Left ventricular diastolic  parameters were normal.   2. Right ventricular systolic function is normal. The right ventricular  size is normal. Tricuspid regurgitation signal is inadequate for assessing  PA pressure.   3. The mitral valve is normal in structure. Mild mitral valve  regurgitation. No evidence of mitral stenosis.   4. The aortic valve is normal in structure. Aortic valve regurgitation is  not visualized. No aortic stenosis is present.   5. The inferior vena cava is normal in size with greater than 50%  respiratory variability, suggesting right atrial pressure of 3 mmHg.    Risk Assessment/Calculations           Physical Exam VS:  BP 110/70 (BP Location: Left Arm, Patient Position: Sitting, Cuff Size: Normal)   Pulse 64   Ht 5' 5 (1.651 m)   Wt 154 lb 4 oz (70 kg)   SpO2 98%   BMI 25.67 kg/m        Wt Readings from Last 3  Encounters:  11/11/23 154 lb 4 oz (70 kg)  08/11/23 162 lb (73.5 kg)  06/10/23 162 lb 3.2 oz (73.6 kg)    GEN: Well nourished, well developed in no acute distress NECK: No JVD; No carotid bruits CARDIAC: RRR, no murmurs, rubs, gallops RESPIRATORY:  Clear to auscultation without rales, wheezing or rhonchi  ABDOMEN: Soft, non-tender, non-distended EXTREMITIES:  No edema; No deformity   ASSESSMENT AND PLAN Palpitations that are infrequent and been decreased since last time she was seen in clinic.  Event monitor reviewed  showed an average heart rate of 85 bpm, with no atrial fibrillation or flutter, and no sustained arrhythmias.  She has been encouraged to maintain adequate hydration, rest, sleep, and to avoid caffeine and alcohol.  Atypical chest pain which is now resolved.  Prior echocardiogram showed normal systolic and diastolic function with an LVEF of 55 to 60% with no significant structural abnormalities noted.  Iron deficiency anemia with last hemoglobin of 12.7.  She is continued on oral iron supplements.  Ongoing management per PCP.       Dispo: Patient to return to clinic to see MD/APP in 6 months or sooner if needed for reevaluation.  Signed, Chloris Marcoux, NP

## 2023-11-17 DIAGNOSIS — M25561 Pain in right knee: Secondary | ICD-10-CM | POA: Diagnosis not present

## 2023-11-17 DIAGNOSIS — M6281 Muscle weakness (generalized): Secondary | ICD-10-CM | POA: Diagnosis not present

## 2023-11-17 DIAGNOSIS — S83511D Sprain of anterior cruciate ligament of right knee, subsequent encounter: Secondary | ICD-10-CM | POA: Diagnosis not present

## 2023-11-17 DIAGNOSIS — G8929 Other chronic pain: Secondary | ICD-10-CM | POA: Diagnosis not present

## 2023-11-19 DIAGNOSIS — R002 Palpitations: Secondary | ICD-10-CM | POA: Diagnosis not present

## 2024-01-18 DIAGNOSIS — J0140 Acute pansinusitis, unspecified: Secondary | ICD-10-CM | POA: Diagnosis not present

## 2024-01-18 DIAGNOSIS — H6993 Unspecified Eustachian tube disorder, bilateral: Secondary | ICD-10-CM | POA: Diagnosis not present

## 2024-01-21 ENCOUNTER — Other Ambulatory Visit: Payer: Self-pay | Admitting: Family Medicine

## 2024-01-21 ENCOUNTER — Encounter: Payer: Self-pay | Admitting: Family Medicine

## 2024-01-21 MED ORDER — AMOXICILLIN 875 MG PO TABS: 875.0000 mg | ORAL_TABLET | Freq: Two times a day (BID) | ORAL | 0 refills | Status: AC

## 2024-03-08 ENCOUNTER — Other Ambulatory Visit: Payer: Self-pay | Admitting: Family Medicine

## 2024-03-08 DIAGNOSIS — Z30011 Encounter for initial prescription of contraceptive pills: Secondary | ICD-10-CM

## 2024-03-27 DIAGNOSIS — M9902 Segmental and somatic dysfunction of thoracic region: Secondary | ICD-10-CM | POA: Diagnosis not present

## 2024-03-27 DIAGNOSIS — M546 Pain in thoracic spine: Secondary | ICD-10-CM | POA: Diagnosis not present

## 2024-03-27 DIAGNOSIS — M545 Low back pain, unspecified: Secondary | ICD-10-CM | POA: Diagnosis not present

## 2024-03-27 DIAGNOSIS — M9903 Segmental and somatic dysfunction of lumbar region: Secondary | ICD-10-CM | POA: Diagnosis not present

## 2024-03-29 DIAGNOSIS — M9903 Segmental and somatic dysfunction of lumbar region: Secondary | ICD-10-CM | POA: Diagnosis not present

## 2024-03-29 DIAGNOSIS — M9902 Segmental and somatic dysfunction of thoracic region: Secondary | ICD-10-CM | POA: Diagnosis not present

## 2024-03-29 DIAGNOSIS — M545 Low back pain, unspecified: Secondary | ICD-10-CM | POA: Diagnosis not present

## 2024-03-29 DIAGNOSIS — M546 Pain in thoracic spine: Secondary | ICD-10-CM | POA: Diagnosis not present

## 2024-03-31 DIAGNOSIS — M9903 Segmental and somatic dysfunction of lumbar region: Secondary | ICD-10-CM | POA: Diagnosis not present

## 2024-03-31 DIAGNOSIS — M9902 Segmental and somatic dysfunction of thoracic region: Secondary | ICD-10-CM | POA: Diagnosis not present

## 2024-03-31 DIAGNOSIS — M546 Pain in thoracic spine: Secondary | ICD-10-CM | POA: Diagnosis not present

## 2024-03-31 DIAGNOSIS — M545 Low back pain, unspecified: Secondary | ICD-10-CM | POA: Diagnosis not present

## 2024-04-03 DIAGNOSIS — M545 Low back pain, unspecified: Secondary | ICD-10-CM | POA: Diagnosis not present

## 2024-04-03 DIAGNOSIS — M546 Pain in thoracic spine: Secondary | ICD-10-CM | POA: Diagnosis not present

## 2024-04-03 DIAGNOSIS — M9902 Segmental and somatic dysfunction of thoracic region: Secondary | ICD-10-CM | POA: Diagnosis not present

## 2024-04-03 DIAGNOSIS — M9903 Segmental and somatic dysfunction of lumbar region: Secondary | ICD-10-CM | POA: Diagnosis not present

## 2024-04-10 DIAGNOSIS — M9903 Segmental and somatic dysfunction of lumbar region: Secondary | ICD-10-CM | POA: Diagnosis not present

## 2024-04-10 DIAGNOSIS — M9902 Segmental and somatic dysfunction of thoracic region: Secondary | ICD-10-CM | POA: Diagnosis not present

## 2024-04-10 DIAGNOSIS — M545 Low back pain, unspecified: Secondary | ICD-10-CM | POA: Diagnosis not present

## 2024-04-10 DIAGNOSIS — M546 Pain in thoracic spine: Secondary | ICD-10-CM | POA: Diagnosis not present

## 2024-04-24 DIAGNOSIS — M545 Low back pain, unspecified: Secondary | ICD-10-CM | POA: Diagnosis not present

## 2024-04-24 DIAGNOSIS — M9902 Segmental and somatic dysfunction of thoracic region: Secondary | ICD-10-CM | POA: Diagnosis not present

## 2024-04-24 DIAGNOSIS — M9903 Segmental and somatic dysfunction of lumbar region: Secondary | ICD-10-CM | POA: Diagnosis not present

## 2024-04-24 DIAGNOSIS — M546 Pain in thoracic spine: Secondary | ICD-10-CM | POA: Diagnosis not present

## 2024-05-17 ENCOUNTER — Encounter: Payer: Self-pay | Admitting: Cardiology

## 2024-05-17 ENCOUNTER — Ambulatory Visit: Attending: Cardiology | Admitting: Cardiology

## 2024-05-17 VITALS — BP 100/70 | HR 68 | Ht 65.0 in | Wt 175.0 lb

## 2024-05-17 DIAGNOSIS — R002 Palpitations: Secondary | ICD-10-CM

## 2024-05-17 NOTE — Patient Instructions (Signed)
 Medication Instructions:   Your physician recommends that you continue on your current medications as directed. Please refer to the Current Medication list given to you today.    *If you need a refill on your cardiac medications before your next appointment, please call your pharmacy*  Lab Work:  None ordered at this time   If you have labs (blood work) drawn today and your tests are completely normal, you will receive your results only by:  MyChart Message (if you have MyChart) OR  A paper copy in the mail If you have any lab test that is abnormal or we need to change your treatment, we will call you to review the results.  Testing/Procedures:  None ordered at this time   Referrals:  None ordered at this time   Follow-Up:  At [facility], you and your health needs are our priority.  As part of our continuing mission to provide you with exceptional heart care, our providers are all part of one team.  This team includes your primary Cardiologist (physician) and Advanced Practice Providers or APPs (Physician Assistants and Nurse Practitioners) who all work together to provide you with the care you need, when you need it.  Your next appointment:    Follow up As Needed  Provider:    You may see Redell Cave, MD or one of the following Advanced Practice Providers on your designated Care Team:   Lonni Meager, NP Lesley Maffucci, PA-C Bernardino Bring, PA-C Cadence [place], PA-C Tylene Lunch, NP Barnie Hila, NP    We recommend signing up for the patient portal called MyChart.  Sign up information is provided on this After Visit Summary.  MyChart is used to connect with patients for Virtual Visits (Telemedicine).  Patients are able to view lab/test results, encounter notes, upcoming appointments, etc.  Non-urgent messages can be sent to your provider as well.   To learn more about what you can do with MyChart, go to [email or web address].

## 2024-05-17 NOTE — Progress Notes (Signed)
 " Cardiology Office Note   Date:  05/17/2024  ID:  Lynn Booker, DOB [birth year], MRN [id number] PCP: Sharma Coyer, MD  [facility] Providers Cardiologist:  Redell Cave, MD Cardiology APP:  Gerard Frederick, NP     History of Present Illness Lynn Booker is a 24 y.o. female with a past medical history of atypical chest pain and palpitations, who is here today for follow-up.   She was previously seen in clinic 08/11/2023 by Dr Cave.  She was sent on a referral from her primary care provider stating that she had palpitations that occur randomly and not associated with exertion.  Palpitations have been ongoing several years now occurring very infrequently.  Her last occurrence was possibly a month ago.  She denied any associated symptoms of dizziness, presyncope or syncope.  She also had complaints of left-sided nonexertional chest discomfort occurring randomly.  She works out frequently at the [employer] without any issues.  Labs had been stable.  She had an echocardiogram that was ordered and a ZIO XT monitor to rule out arrhythmias.   She was last seen in clinic 11/11/2023 doing well from a cardiac perspective.  She denies any reoccurrence of palpitations with only 1 episode.  Chest pain had resolved.  She states she been back to the gym and had swelling and discomfort in her knee and been followed by Ortho.  There were no medication changes at that were made and no further testing that was ordered.    She returns to clinic today stating that she has been doing well from a cardiac perspective.  She has had 1 episode of palpitations since she was last seen in clinic.  She denies any reoccurrence of chest discomfort.  Denies any hospitalizations or visits to the emergency department.  ROS: 10 point review of system has been reviewed and considered negative the exception was been listed in the HPI  Studies Reviewed EKG Interpretation Date/Time:  Wednesday May 17 2024  09:44:59 EST Ventricular Rate:  68 PR Interval:  148 QRS Duration:  80 QT Interval:  404 QTC Calculation: 429 R Axis:   62  Text Interpretation: Normal sinus rhythm with sinus arrhythmia Normal ECG When compared with ECG of 11-Aug-2023 10:08, No significant change was found Confirmed by Gerard Frederick ([id number]) on 05/17/2024 9:46:23 AM    Event Monitor (Zio) 09/23/2023 Patient had a min HR of 44 bpm, max HR of 195 bpm, and avg HR of 85 bpm. Predominant underlying rhythm was Sinus Rhythm. Isolated SVEs were rare (<1.0%), SVE Couplets were rare (<1.0%), and no SVE Triplets were present. Isolated VEs were rare (<1.0%), VE  Couplets were rare (<1.0%), and no VE Triplets were present. Ventricular Trigeminy was present.    Conclusion Average heart rate 85 bpm, range 44-195. No atrial fibrillation or atrial flutter. No sustained arrhythmias.   2D echo 09/08/2023  1. Left ventricular ejection fraction, by estimation, is 55 to 60%. Left  ventricular ejection fraction by PLAX is 55 %. The left ventricle has  normal function. The left ventricle has no regional wall motion  abnormalities. Left ventricular diastolic  parameters were normal.   2. Right ventricular systolic function is normal. The right ventricular  size is normal. Tricuspid regurgitation signal is inadequate for assessing  PA pressure.   3. The mitral valve is normal in structure. Mild mitral valve  regurgitation. No evidence of mitral stenosis.   4. The aortic valve is normal in structure. Aortic valve regurgitation is  not  visualized. No aortic stenosis is present.   5. The inferior vena cava is normal in size with greater than 50%  respiratory variability, suggesting right atrial pressure of 3 mmHg.    Risk Assessment/Calculations           Physical Exam VS:  BP 100/70 (BP Location: Left Arm, Patient Position: Sitting, Cuff Size: Normal)   Pulse 68   Ht 5' 5 (1.651 m)   Wt 175 lb (79.4 kg)   SpO2 99%   BMI 29.12 kg/m         Wt Readings from Last 3 Encounters:  05/17/24 175 lb (79.4 kg)  11/11/23 154 lb 4 oz (70 kg)  08/11/23 162 lb (73.5 kg)    GEN: Well nourished, well developed in no acute distress NECK: No JVD; No carotid bruits CARDIAC: RRR, no murmurs, rubs, gallops RESPIRATORY:  Clear to auscultation without rales, wheezing or rhonchi  ABDOMEN: Soft, non-tender, non-distended EXTREMITIES:  No edema; No deformity   ASSESSMENT AND PLAN Palpitations that have been infrequent and only had 1 episode since she was last seen in clinic in July 2025.  Event monitor showed an average heart rate of 85 bpm with no arrhythmias.  EKG today reveals sinus rhythm with sinus arrhythmia with a rate of 68 with no acute ischemic changes.       Dispo: Patient to return to clinic to see MD/APP on an as needed basis  Signed, Tallon Gertz, NP   "

## 2024-07-13 ENCOUNTER — Encounter: Admitting: Family Medicine

## 2024-07-26 ENCOUNTER — Encounter: Admitting: Family Medicine
